# Patient Record
Sex: Female | Born: 2000 | Race: Black or African American | Hispanic: No | Marital: Single | Smoking: Never smoker
Health system: Southern US, Community
[De-identification: ages and names within clinical notes are randomized; demographics above are authoritative.]

## PROBLEM LIST (undated history)

## (undated) DIAGNOSIS — F319 Bipolar disorder, unspecified: Secondary | ICD-10-CM

## (undated) DIAGNOSIS — F32A Depression, unspecified: Secondary | ICD-10-CM

## (undated) DIAGNOSIS — Z789 Other specified health status: Secondary | ICD-10-CM

## (undated) HISTORY — PX: OTHER SURGICAL HISTORY: SHX169

## (undated) HISTORY — PX: NO PAST SURGERIES: SHX2092

## (undated) HISTORY — DX: Other specified health status: Z78.9

---
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
## 2022-06-11 ENCOUNTER — Ambulatory Visit (HOSPITAL_COMMUNITY)
Admission: EM | Admit: 2022-06-11 | Discharge: 2022-06-11 | Disposition: A | Discharge status: Home / Self Care | Payer: Medicaid Other

## 2022-06-11 NOTE — ED Notes (Signed)
No answer in waiting x 3.

## 2022-06-11 NOTE — ED Notes (Signed)
No answer x 2 in waiting area.

## 2022-09-28 ENCOUNTER — Encounter (HOSPITAL_BASED_OUTPATIENT_CLINIC_OR_DEPARTMENT_OTHER): Payer: Self-pay | Admitting: *Deleted

## 2022-09-28 ENCOUNTER — Other Ambulatory Visit: Payer: Self-pay

## 2022-09-28 ENCOUNTER — Emergency Department (HOSPITAL_BASED_OUTPATIENT_CLINIC_OR_DEPARTMENT_OTHER)
Admission: EM | Admit: 2022-09-28 | Discharge: 2022-09-28 | Disposition: A | Discharge status: Home / Self Care | Payer: MEDICAID | Attending: Emergency Medicine | Admitting: Emergency Medicine

## 2022-09-28 DIAGNOSIS — E876 Hypokalemia: Secondary | ICD-10-CM | POA: Diagnosis not present

## 2022-09-28 DIAGNOSIS — A5901 Trichomonal vulvovaginitis: Secondary | ICD-10-CM | POA: Diagnosis not present

## 2022-09-28 DIAGNOSIS — R1030 Lower abdominal pain, unspecified: Secondary | ICD-10-CM | POA: Diagnosis present

## 2022-09-28 LAB — COMPREHENSIVE METABOLIC PANEL
ALT: 19 U/L (ref 0–44)
AST: 19 U/L (ref 15–41)
Albumin: 4.3 g/dL (ref 3.5–5.0)
Alkaline Phosphatase: 35 U/L — ABNORMAL LOW (ref 38–126)
Anion gap: 8 (ref 5–15)
BUN: 11 mg/dL (ref 6–20)
CO2: 22 mmol/L (ref 22–32)
Calcium: 8.9 mg/dL (ref 8.9–10.3)
Chloride: 105 mmol/L (ref 98–111)
Creatinine, Ser: 0.73 mg/dL (ref 0.44–1.00)
GFR, Estimated: >60 mL/min (ref >60)
Glucose, Bld: 91 mg/dL (ref 70–99)
Potassium: 3.4 mmol/L — ABNORMAL LOW (ref 3.5–5.1)
Sodium: 135 mmol/L (ref 135–145)
Total Bilirubin: 0.7 mg/dL (ref 0.3–1.2)
Total Protein: 8.4 g/dL — ABNORMAL HIGH (ref 6.5–8.1)

## 2022-09-28 LAB — CBC WITH DIFFERENTIAL/PLATELET
Abs Immature Granulocytes: 0.01 10*3/uL (ref 0.00–0.07)
Basophils Absolute: 0 10*3/uL (ref 0.0–0.1)
Basophils Relative: 1 %
Eosinophils Absolute: 0.1 10*3/uL (ref 0.0–0.5)
Eosinophils Relative: 3 %
HCT: 36 % (ref 36.0–46.0)
Hemoglobin: 11.5 g/dL — ABNORMAL LOW (ref 12.0–15.0)
Immature Granulocytes: 0 %
Lymphocytes Relative: 49 %
Lymphs Abs: 2.8 10*3/uL (ref 0.7–4.0)
MCH: 23.9 pg — ABNORMAL LOW (ref 26.0–34.0)
MCHC: 31.9 g/dL (ref 30.0–36.0)
MCV: 74.8 fL — ABNORMAL LOW (ref 80.0–100.0)
Monocytes Absolute: 0.5 10*3/uL (ref 0.1–1.0)
Monocytes Relative: 8 %
Neutro Abs: 2.2 10*3/uL (ref 1.7–7.7)
Neutrophils Relative %: 39 %
Platelets: 289 10*3/uL (ref 150–400)
RBC: 4.81 MIL/uL (ref 3.87–5.11)
RDW: 15.9 % — ABNORMAL HIGH (ref 11.5–15.5)
WBC: 5.7 10*3/uL (ref 4.0–10.5)
nRBC: 0 % (ref 0.0–0.2)

## 2022-09-28 LAB — PREGNANCY, URINE: Preg Test, Ur: NEGATIVE

## 2022-09-28 LAB — WET PREP, GENITAL
Sperm: NONE SEEN
WBC, Wet Prep HPF POC: >=10 — AB (ref <10)
Yeast Wet Prep HPF POC: NONE SEEN

## 2022-09-28 LAB — URINALYSIS, W/ REFLEX TO CULTURE (INFECTION SUSPECTED)
Bilirubin Urine: NEGATIVE
Glucose, UA: NEGATIVE mg/dL
Ketones, ur: NEGATIVE mg/dL
Nitrite: NEGATIVE
Protein, ur: 100 mg/dL — AB
Specific Gravity, Urine: 1.025 (ref 1.005–1.030)
WBC, UA: >50 WBC/hpf (ref 0–5)
pH: 7.5 (ref 5.0–8.0)

## 2022-09-28 LAB — LIPASE, BLOOD: Lipase: 26 U/L (ref 11–51)

## 2022-09-28 MED ORDER — LIDOCAINE HCL (PF) 1 % IJ SOLN
INTRAMUSCULAR | Status: AC
  Administered 2022-09-28: 1 mL
  Filled 2022-09-28: qty 5

## 2022-09-28 MED ORDER — CEFTRIAXONE SODIUM 500 MG IJ SOLR
500.0000 mg | Freq: Once | INTRAMUSCULAR | Status: AC
  Administered 2022-09-28: 500 mg via INTRAMUSCULAR
  Filled 2022-09-28: qty 500

## 2022-09-28 MED ORDER — DOXYCYCLINE HYCLATE 100 MG PO TABS
100.0000 mg | ORAL_TABLET | Freq: Once | ORAL | Status: AC
  Administered 2022-09-28: 100 mg via ORAL
  Filled 2022-09-28: qty 1

## 2022-09-28 MED ORDER — POTASSIUM CHLORIDE CRYS ER 20 MEQ PO TBCR
40.0000 meq | EXTENDED_RELEASE_TABLET | Freq: Once | ORAL | Status: AC
  Administered 2022-09-28: 40 meq via ORAL
  Filled 2022-09-28: qty 2

## 2022-09-28 MED ORDER — DOXYCYCLINE HYCLATE 100 MG PO CAPS: 100.0000 mg | ORAL_CAPSULE | Freq: Two times a day (BID) | ORAL | 0 refills | Status: DC

## 2022-09-28 MED ORDER — METRONIDAZOLE 500 MG PO TABS: 500.0000 mg | ORAL_TABLET | Freq: Two times a day (BID) | ORAL | 0 refills | Status: DC

## 2022-09-28 MED ORDER — METRONIDAZOLE 500 MG PO TABS
500.0000 mg | ORAL_TABLET | Freq: Once | ORAL | Status: AC
  Administered 2022-09-28: 500 mg via ORAL
  Filled 2022-09-28: qty 1

## 2022-09-28 NOTE — ED Provider Notes (Signed)
[place] [facility] Provider Note   CSN: [id number] Arrival date & time: 09/28/22  1635     History  Chief Complaint  Patient presents with   Abdominal Pain    Kiara Moreno is a 22 y.o. female.  HPI 22 year old female presents with multiple complaints.  Her chief complaint is lower abdominal pain.  For the past couple months she has been having lower abdominal pain.  Feels like a cramping sensation that lasts a couple minutes.  Mostly notices it about a minute or so after eating.  She has not had a menstrual cycle in over a month which is atypical for her.  She took pregnancy test that were negative.  She also been having vaginal discharge for the past couple months.  She has unprotected intercourse with 1 person that would like STI testing.  She states she also gets on and off chest pain after smoking.  No current abdominal or chest pain.  No dysuria.  Home Medications Prior to Admission medications   Medication Sig Start Date End Date Taking? Authorizing Provider  doxycycline (VIBRAMYCIN) 100 MG capsule Take 1 capsule (100 mg total) by mouth 2 (two) times daily. 09/28/22  Yes Pricilla Loveless, MD  metroNIDAZOLE (FLAGYL) 500 MG tablet Take 1 tablet (500 mg total) by mouth 2 (two) times daily. 09/28/22  Yes Pricilla Loveless, MD  ibuprofen (ADVIL) 600 MG tablet Take 1 tablet (600 mg total) by mouth every 6 (six) hours as needed. 04/13/21   Arabella Merles, CNM  Norethindrone Acetate-Ethinyl Estrad-FE (LOESTRIN 24 FE) 1-20 MG-MCG(24) tablet Take 1 tablet by mouth daily. 06/20/21   Brock Bad, MD  Prenat-Fe Poly-Methfol-FA-DHA (VITAFOL ULTRA) 29-0.6-0.4-200 MG CAPS Take 1 capsule by mouth daily before breakfast. 06/20/21   Brock Bad, MD      Allergies    Patient has no known allergies.    Review of Systems   Review of Systems  Respiratory:  Negative for shortness of breath.   Cardiovascular:  Positive for chest pain.  Gastrointestinal:   Positive for abdominal pain.  Genitourinary:  Positive for menstrual problem and vaginal discharge. Negative for dysuria.    Physical Exam Updated Vital Signs BP (!) 146/82 (BP Location: Right Arm)   Pulse 72   Temp 98.4 F (36.9 C) (Oral)   Resp 17   SpO2 100%  Physical Exam Vitals and nursing note reviewed.  Constitutional:      Appearance: She is well-developed.  HENT:     Head: Normocephalic and atraumatic.  Cardiovascular:     Rate and Rhythm: Normal rate and regular rhythm.     Heart sounds: Normal heart sounds.  Pulmonary:     Effort: Pulmonary effort is normal.     Breath sounds: Normal breath sounds.  Abdominal:     Palpations: Abdomen is soft.     Tenderness: There is no abdominal tenderness.  Skin:    General: Skin is warm and dry.  Neurological:     Mental Status: She is alert.     ED Results / Procedures / Treatments   Labs (all labs ordered are listed, but only abnormal results are displayed) Labs Reviewed  WET PREP, GENITAL - Abnormal; Notable for the following components:      Result Value   Trich, Wet Prep PRESENT (*)    Clue Cells Wet Prep HPF POC PRESENT (*)    WBC, Wet Prep HPF POC >=10 (*)    All other components within  normal limits  URINALYSIS, W/ REFLEX TO CULTURE (INFECTION SUSPECTED) - Abnormal; Notable for the following components:   APPearance CLOUDY (*)    Hgb urine dipstick TRACE (*)    Protein, ur 100 (*)    Leukocytes,Ua MODERATE (*)    Bacteria, UA MANY (*)    All other components within normal limits  CBC WITH DIFFERENTIAL/PLATELET - Abnormal; Notable for the following components:   Hemoglobin 11.5 (*)    MCV 74.8 (*)    MCH 23.9 (*)    RDW 15.9 (*)    All other components within normal limits  PREGNANCY, URINE  RPR  HIV ANTIBODY (ROUTINE TESTING W REFLEX)  COMPREHENSIVE METABOLIC PANEL  LIPASE, BLOOD  GC/CHLAMYDIA PROBE AMP ([place]) NOT AT [facility]    EKG EKG Interpretation Date/Time:  "Sunday September 28 2022  22:23:50 EDT Ventricular Rate:  76 PR Interval:  157 QRS Duration:  83 QT Interval:  385 QTC Calculation: 433 R Axis:   12  Text Interpretation: Sinus rhythm no acute ST/T changes no significant change since 2022 Confirmed by Tahmid Stonehocker ([id number]) on 09/28/2022 10:29:04 PM  Radiology No results found.  Procedures Procedures    Medications Ordered in ED Medications  cefTRIAXone (ROCEPHIN) injection 500 mg (has no administration in time range)  doxycycline (VIBRA-TABS) tablet 100 mg (has no administration in time range)  metroNIDAZOLE (FLAGYL) tablet 500 mg (has no administration in time range)    ED Course/ Medical Decision Making/ A&P                                 Medical Decision Making Amount and/or Complexity of Data Reviewed Labs: ordered.    Details: Hemoglobin chronically low and stable.  Trichomonas positive on wet prep.   Risk Prescription drug management.   Patient's testing shows positive trichomonas on her wet prep.  She had concerned about STI and so we will treat with a week of Flagyl.  There is also other leukocytes on the wet prep after discussion, she would prefer to just go ahead and treat for GC/chlamydia with Rocephin and doxycycline.  She has had a history of abdominal pain on and off but this seems pretty mild and intermittent and there is no tenderness now so I think PID or TOA or other acute complication is highly unlikely.  I do not think imaging is needed right now.  Mildly low potassium will be repleted.  Will discharge home with return precautions.        Final Clinical Impression(s) / ED Diagnoses Final diagnoses:  Trichomonas vaginitis    Rx / DC Orders ED Discharge Orders          Ordered    doxycycline (VIBRAMYCIN) 100 MG capsule  2 times daily        09/28/22 2307    metroNIDAZOLE (FLAGYL) 500 MG tablet  2 times daily        08" /11/24 2307              Pricilla Loveless, MD 09/28/22 2329

## 2022-09-28 NOTE — ED Triage Notes (Addendum)
Here for abd cramping intermittently for > 1` month. Denies sx at this time.

## 2022-09-28 NOTE — Discharge Instructions (Addendum)
You are being treated for sexually transmitted infection called trichomonas.  You need to take the full course of antibiotics.  We are also covering you for potential other sexually transmitted infections that will not result for the next couple days.  Do not drink alcohol or combine these medications with other medications during the course that you are on them for the next week.  Do not have intercourse until your symptoms have cleared and you have finished the antibiotics.  Follow-up with the health department.  If you develop new or worsening abdominal pain, vomiting, fever, or any other new/concerning symptoms then return to the ER or call 911.

## 2022-10-16 ENCOUNTER — Other Ambulatory Visit: Payer: Self-pay

## 2022-10-16 ENCOUNTER — Encounter (HOSPITAL_BASED_OUTPATIENT_CLINIC_OR_DEPARTMENT_OTHER): Payer: Self-pay

## 2022-10-16 DIAGNOSIS — R102 Pelvic and perineal pain: Secondary | ICD-10-CM | POA: Insufficient documentation

## 2022-10-16 DIAGNOSIS — N898 Other specified noninflammatory disorders of vagina: Secondary | ICD-10-CM | POA: Insufficient documentation

## 2022-10-16 NOTE — ED Triage Notes (Signed)
Pt reports left side cramping x 2 days and thick yellow vaginal discharge with itching x 2 weeks. Pt seen and treated for same x 2 weeks ago and states that discharge subsided while taking antibiotics but after having sex, symptoms returned.

## 2022-10-17 ENCOUNTER — Emergency Department (HOSPITAL_BASED_OUTPATIENT_CLINIC_OR_DEPARTMENT_OTHER): Payer: MEDICAID

## 2022-10-17 ENCOUNTER — Emergency Department (HOSPITAL_BASED_OUTPATIENT_CLINIC_OR_DEPARTMENT_OTHER)
Admission: EM | Admit: 2022-10-17 | Discharge: 2022-10-17 | Disposition: A | Discharge status: Home / Self Care | Payer: MEDICAID | Attending: Emergency Medicine | Admitting: Emergency Medicine

## 2022-10-17 DIAGNOSIS — R102 Pelvic and perineal pain: Secondary | ICD-10-CM

## 2022-10-17 DIAGNOSIS — B9689 Other specified bacterial agents as the cause of diseases classified elsewhere: Secondary | ICD-10-CM

## 2022-10-17 LAB — WET PREP, GENITAL
Sperm: NONE SEEN
Trich, Wet Prep: NONE SEEN
WBC, Wet Prep HPF POC: <10 (ref <10)
Yeast Wet Prep HPF POC: NONE SEEN

## 2022-10-17 LAB — URINALYSIS, ROUTINE W REFLEX MICROSCOPIC
Bilirubin Urine: NEGATIVE
Glucose, UA: NEGATIVE mg/dL
Hgb urine dipstick: NEGATIVE
Nitrite: NEGATIVE
Protein, ur: 30 mg/dL — AB
Specific Gravity, Urine: 1.037 — ABNORMAL HIGH (ref 1.005–1.030)
pH: 7 (ref 5.0–8.0)

## 2022-10-17 LAB — PREGNANCY, URINE: Preg Test, Ur: NEGATIVE

## 2022-10-17 MED ORDER — METRONIDAZOLE 500 MG PO TABS: 500.0000 mg | ORAL_TABLET | Freq: Two times a day (BID) | ORAL | 0 refills | Status: DC

## 2022-10-17 NOTE — ED Provider Notes (Signed)
[place] [facility] AT [facility] Provider Note   CSN: [id number] Arrival date & time: 10/16/22  2059     History  Chief Complaint  Patient presents with   Vaginal Discharge    Kiara Moreno is a 22 y.o. female.  Patient is a 22 year old female with no significant past medical history.  Patient presenting today with complaints of pelvic pain and vaginal discharge.  She reports having pelvic pain following intercourse over the past year since having her first child.  She is now describing vaginal discharge that has a bad smell.  This seems to occur primarily following intercourse.  She denies any fevers or chills.  No alleviating factors.  The history is provided by the patient.       Home Medications Prior to Admission medications   Medication Sig Start Date End Date Taking? Authorizing Provider  doxycycline (VIBRAMYCIN) 100 MG capsule Take 1 capsule (100 mg total) by mouth 2 (two) times daily. 09/28/22   Pricilla Loveless, MD  ibuprofen (ADVIL) 600 MG tablet Take 1 tablet (600 mg total) by mouth every 6 (six) hours as needed. 04/13/21   Arabella Merles, CNM  metroNIDAZOLE (FLAGYL) 500 MG tablet Take 1 tablet (500 mg total) by mouth 2 (two) times daily. 09/28/22   Pricilla Loveless, MD  Norethindrone Acetate-Ethinyl Estrad-FE (LOESTRIN 24 FE) 1-20 MG-MCG(24) tablet Take 1 tablet by mouth daily. 06/20/21   Brock Bad, MD  Prenat-Fe Poly-Methfol-FA-DHA (VITAFOL ULTRA) 29-0.6-0.4-200 MG CAPS Take 1 capsule by mouth daily before breakfast. 06/20/21   Brock Bad, MD      Allergies    Patient has no known allergies.    Review of Systems   Review of Systems  All other systems reviewed and are negative.   Physical Exam Updated Vital Signs BP 125/75 (BP Location: Right Arm)   Pulse 90   Temp 97.8 F (36.6 C)   Resp 20   SpO2 100%  Physical Exam Vitals and nursing note reviewed.  Constitutional:      General: She is not in acute distress.     Appearance: She is well-developed. She is not diaphoretic.  HENT:     Head: Normocephalic and atraumatic.  Cardiovascular:     Rate and Rhythm: Normal rate and regular rhythm.     Heart sounds: No murmur heard.    No friction rub. No gallop.  Pulmonary:     Effort: Pulmonary effort is normal. No respiratory distress.     Breath sounds: Normal breath sounds. No wheezing.  Abdominal:     General: Bowel sounds are normal. There is no distension.     Palpations: Abdomen is soft.     Tenderness: There is abdominal tenderness. There is no guarding or rebound.     Comments: There is mild tenderness in the suprapubic region.  Musculoskeletal:        General: Normal range of motion.     Cervical back: Normal range of motion and neck supple.  Skin:    General: Skin is warm and dry.  Neurological:     General: No focal deficit present.     Mental Status: She is alert and oriented to person, place, and time.     ED Results / Procedures / Treatments   Labs (all labs ordered are listed, but only abnormal results are displayed) Labs Reviewed  URINALYSIS, ROUTINE W REFLEX MICROSCOPIC - Abnormal; Notable for the following components:      Result Value   Specific  Gravity, Urine 1.037 (*)    Ketones, ur TRACE (*)    Protein, ur 30 (*)    Leukocytes,Ua MODERATE (*)    Bacteria, UA RARE (*)    All other components within normal limits  WET PREP, GENITAL  PREGNANCY, URINE  GC/CHLAMYDIA PROBE AMP ([place]) NOT AT [facility]    EKG None  Radiology No results found.  Procedures Procedures    Medications Ordered in ED Medications - No data to display  ED Course/ Medical Decision Making/ A&P  Patient is a 22 year old female presenting with complaints of pelvic pain and vaginal discharge as described in the HPI.  She arrives here with stable vital signs and physical examination which is basically unremarkable.  Urinalysis shows moderate leukocytes, but negative nitrates and no bacteria.   Pregnancy test is negative.  Vaginal swab shows clue cells on the wet prep.  Ultrasound of the pelvis obtained showing no evidence for torsion or other acute abnormality.  Patient to be treated with Flagyl for BV.  GC and Chlamydia test pending and patient will be notified if positive.  Final Clinical Impression(s) / ED Diagnoses Final diagnoses:  None    Rx / DC Orders ED Discharge Orders     None         Geoffery Lyons, MD 10/17/22 [phone]

## 2022-10-17 NOTE — Discharge Instructions (Signed)
Begin taking Flagyl as prescribed.  Follow-up with your GYN if symptoms persist.

## 2022-10-23 LAB — GC/CHLAMYDIA PROBE AMP (~~LOC~~) NOT AT ARMC
Chlamydia: NEGATIVE
Comment: NEGATIVE
Comment: NORMAL
Neisseria Gonorrhea: NEGATIVE

## 2022-12-22 IMAGING — US US OB < 14 WEEKS - US OB TV
1 series · 13 of 28 positions shown · non-contrast
Comparison: None

CLINICAL DATA: Vaginal bleeding in first trimester of pregnancy,
quantitative beta HCG

EXAM:
TWIN OBSTETRIC <14WK US AND TRANSVAGINAL OB US
TECHNIQUE: Both transabdominal and transvaginal ultrasound examinations were
performed for complete evaluation of the gestation as well as the
maternal uterus, adnexal regions, and pelvic cul-de-sac.
Transvaginal technique was performed to assess early pregnancy.

[Series 1: us ob < 14 weeks - us ob tv · 63 acquisitions, 13 frames shown]
[im 3/63]
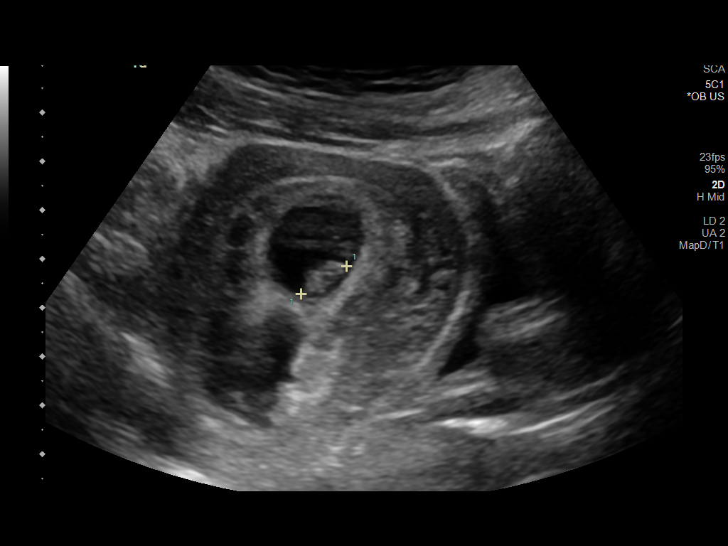
[im 7/63]
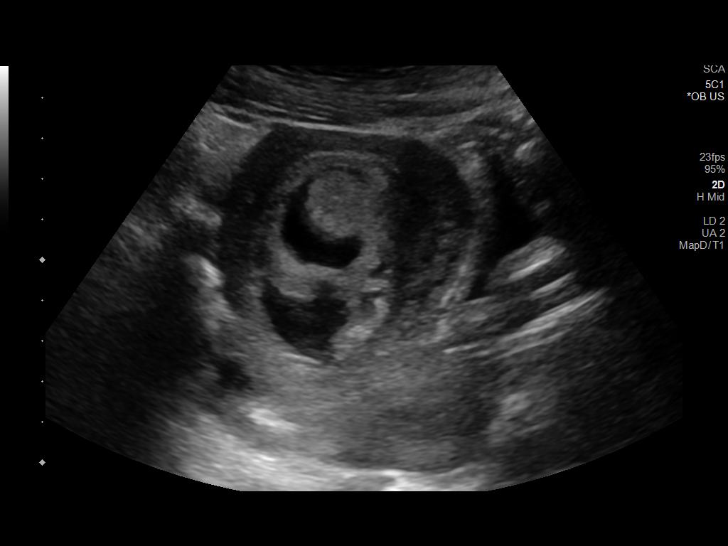
[im 12/63]
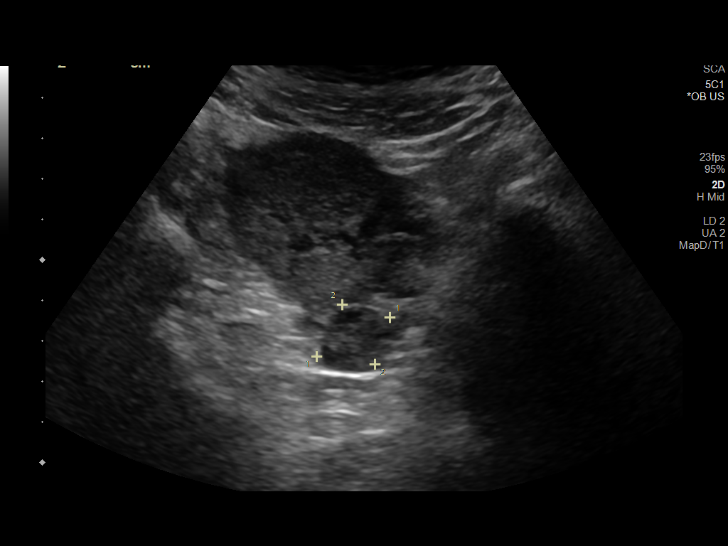
[im 17/63]
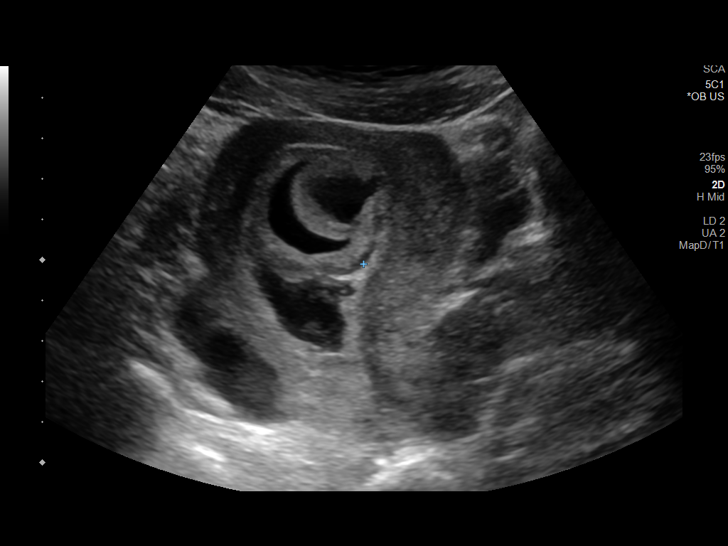
[im 21/63]
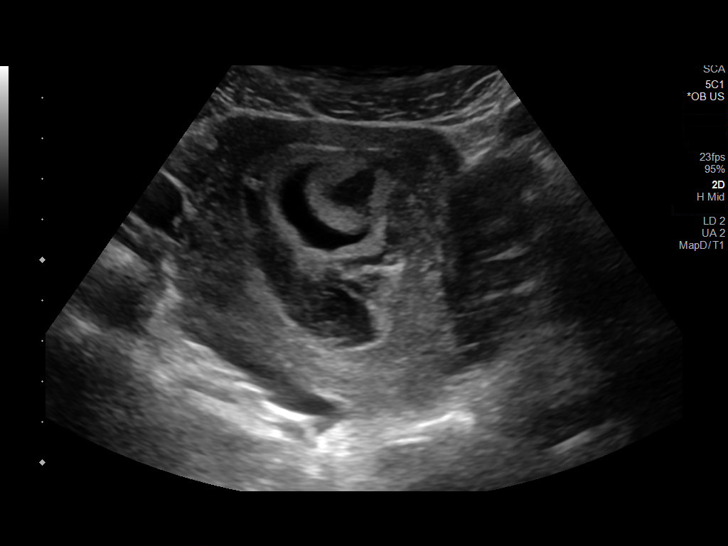
[im 26/63]
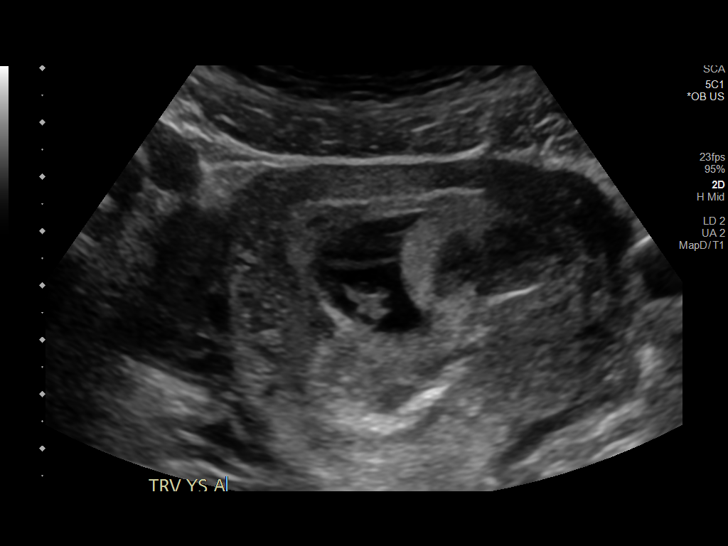
[im 33/63]
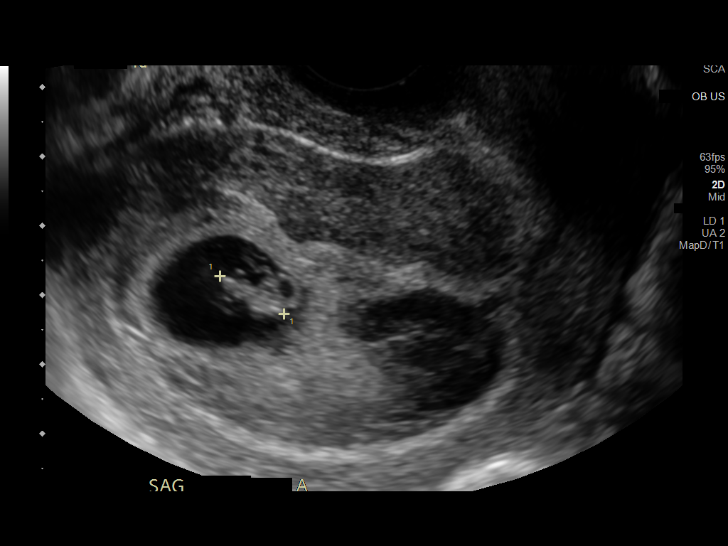
[im 37/63]
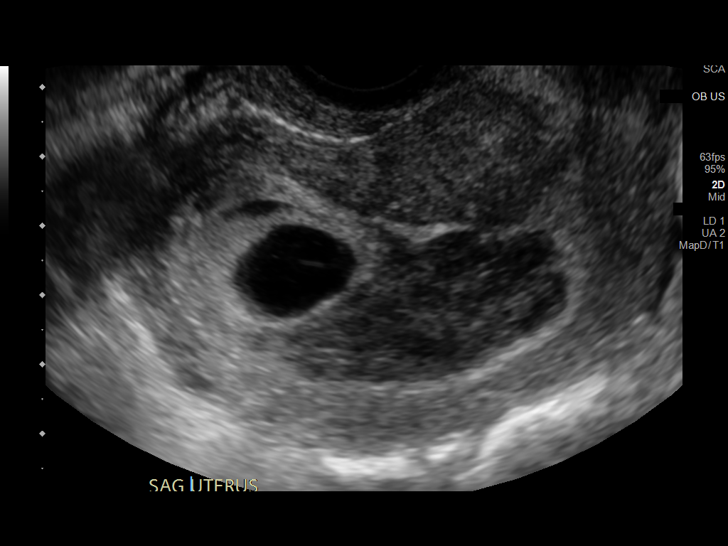
[im 42/63]
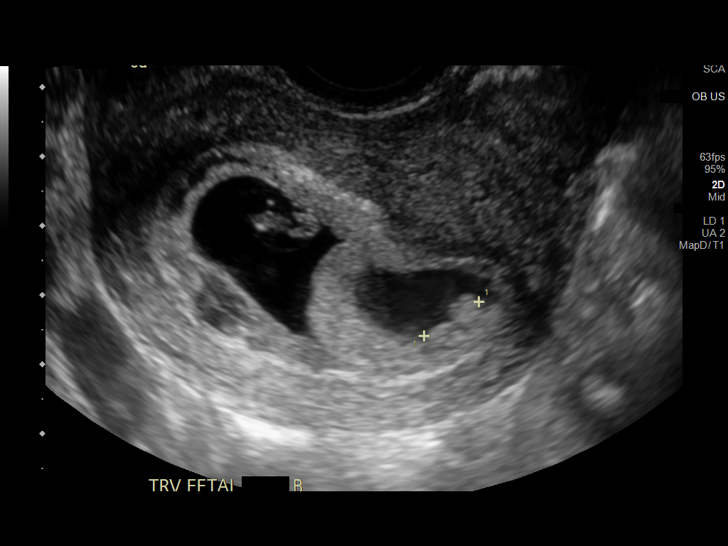
[im 46/63]
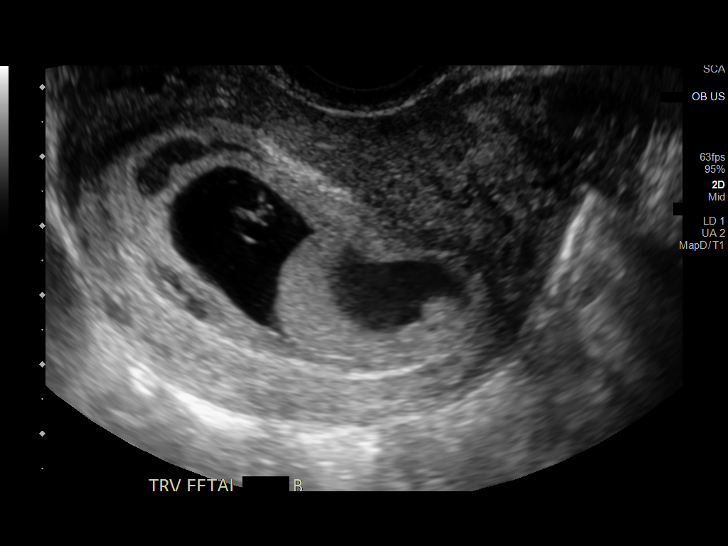
[im 51/63]
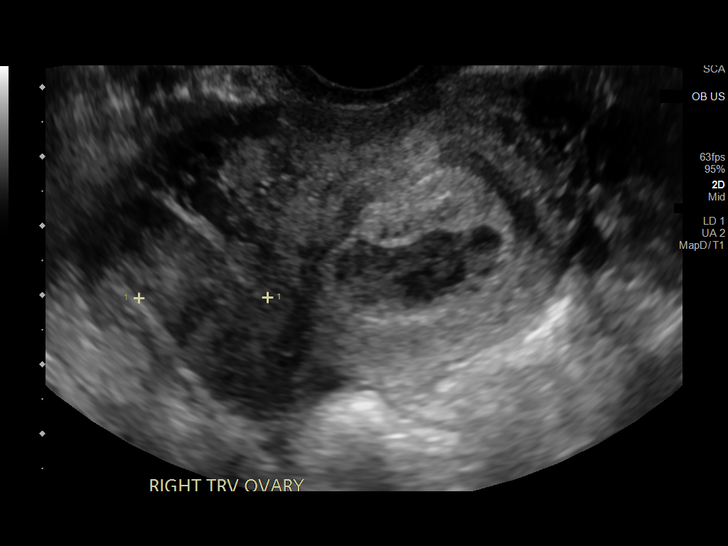
[im 56/63]
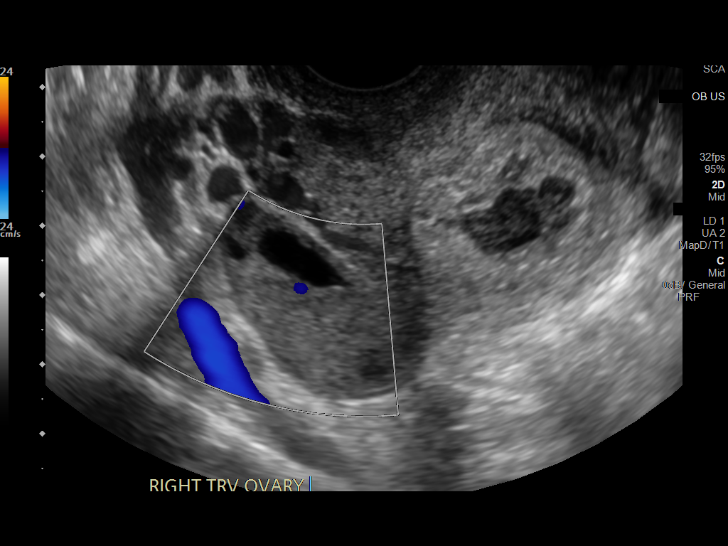
[im 60/63]
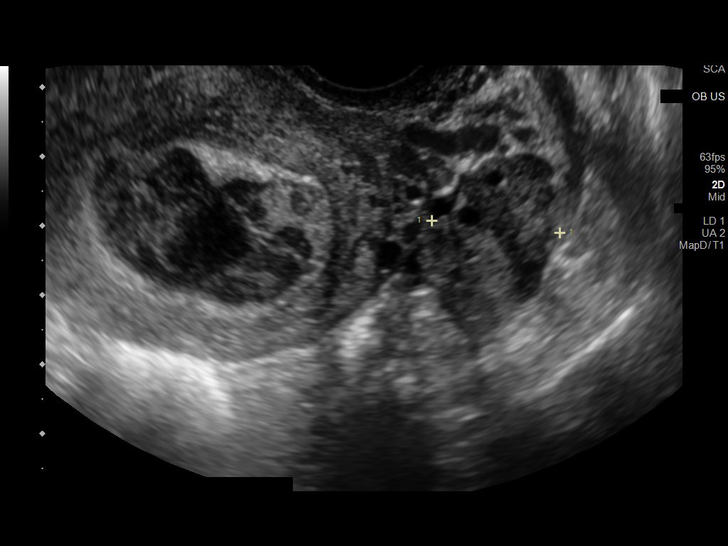

[13 of 28 positions shown; findings below may reference images not displayed]

FINDINGS: Number of IUPs:  2

Chorionicity/Amnionicity:  Dichorionic-diamniotic (thick membrane)

TWIN 1

Yolk sac:  Present

Embryo:  Present

Cardiac Activity: Present

Heart Rate: 141 bpm

CRL:  10.7 mm   7 w 1 d                  US EDC: 05/19/2021

TWIN 2

Yolk sac:  Absent

Embryo:  Present

Cardiac Activity: Absent

Heart Rate: N/A bpm

CRL:  8.2 mm   6 w 5 d                  US EDC: 05/22/2021

Subchorionic hemorrhage:  Large subchorionic hemorrhage

Maternal uterus/adnexae:

RIGHT ovary measures 4.6 x 1.7 x 2.6 cm and contains a 3.6 cm
diameter simple cyst.

LEFT ovary normal size and morphology, 1.9 x 2.8 x 1.8 cm.

No free pelvic fluid or adnexal masses.
IMPRESSION: Twin intrauterine gestation with large subchronic hemorrhage.

Twin 1 appears live at 7 weeks 1 day EGA by crown-rump length, no
abnormalities.

Twin 2 demonstrates no fetal cardiac activity; findings meet
definitive criteria for failed pregnancy of Twin 2. This follows SRU
consensus guidelines: Diagnostic Criteria for Nonviable Pregnancy
Early in the First Trimester. N Engl J Med 4533;[DATE].

## 2023-01-28 ENCOUNTER — Other Ambulatory Visit: Payer: Self-pay

## 2023-01-28 ENCOUNTER — Encounter (HOSPITAL_COMMUNITY): Payer: Self-pay

## 2023-01-28 ENCOUNTER — Emergency Department (HOSPITAL_COMMUNITY)
Admission: EM | Admit: 2023-01-28 | Discharge: 2023-01-29 | Discharge status: Against Medical Advice | Payer: MEDICAID | Attending: Emergency Medicine | Admitting: Emergency Medicine

## 2023-01-28 DIAGNOSIS — Z5321 Procedure and treatment not carried out due to patient leaving prior to being seen by health care provider: Secondary | ICD-10-CM | POA: Insufficient documentation

## 2023-01-28 DIAGNOSIS — L292 Pruritus vulvae: Secondary | ICD-10-CM | POA: Diagnosis not present

## 2023-01-28 DIAGNOSIS — R309 Painful micturition, unspecified: Secondary | ICD-10-CM | POA: Insufficient documentation

## 2023-01-28 DIAGNOSIS — R519 Headache, unspecified: Secondary | ICD-10-CM | POA: Insufficient documentation

## 2023-01-28 NOTE — ED Triage Notes (Signed)
Pt came to ED for HA that started yesterday. C/O itching to vagina and painful urination. Pt also states vaginal odor. Axox4.

## 2023-01-29 NOTE — ED Notes (Signed)
This patient was called x3 and no answer. This patient was moved OTF.

## 2023-04-05 ENCOUNTER — Encounter (HOSPITAL_BASED_OUTPATIENT_CLINIC_OR_DEPARTMENT_OTHER): Payer: Self-pay | Admitting: Emergency Medicine

## 2023-04-05 ENCOUNTER — Other Ambulatory Visit: Payer: Self-pay

## 2023-04-05 DIAGNOSIS — R112 Nausea with vomiting, unspecified: Secondary | ICD-10-CM | POA: Insufficient documentation

## 2023-04-05 DIAGNOSIS — H53149 Visual discomfort, unspecified: Secondary | ICD-10-CM | POA: Diagnosis not present

## 2023-04-05 DIAGNOSIS — R519 Headache, unspecified: Secondary | ICD-10-CM | POA: Insufficient documentation

## 2023-04-05 LAB — CBC
HCT: 36.3 % (ref 36.0–46.0)
Hemoglobin: 11.8 g/dL — ABNORMAL LOW (ref 12.0–15.0)
MCH: 26.7 pg (ref 26.0–34.0)
MCHC: 32.5 g/dL (ref 30.0–36.0)
MCV: 82.1 fL (ref 80.0–100.0)
Platelets: 243 10*3/uL (ref 150–400)
RBC: 4.42 MIL/uL (ref 3.87–5.11)
RDW: 16.4 % — ABNORMAL HIGH (ref 11.5–15.5)
WBC: 6.9 10*3/uL (ref 4.0–10.5)
nRBC: 0 % (ref 0.0–0.2)

## 2023-04-05 MED ORDER — OXYCODONE-ACETAMINOPHEN 5-325 MG PO TABS
1.0000 | ORAL_TABLET | ORAL | Status: DC | PRN
  Administered 2023-04-05: 1 via ORAL
  Filled 2023-04-05: qty 1

## 2023-04-05 MED ORDER — ONDANSETRON 4 MG PO TBDP
4.0000 mg | ORAL_TABLET | Freq: Once | ORAL | Status: AC | PRN
  Administered 2023-04-05: 4 mg via ORAL
  Filled 2023-04-05: qty 1

## 2023-04-05 NOTE — ED Triage Notes (Signed)
Headache, emesis, X since yesterday. Tried bland diet did not help.

## 2023-04-06 ENCOUNTER — Emergency Department (HOSPITAL_BASED_OUTPATIENT_CLINIC_OR_DEPARTMENT_OTHER)
Admission: EM | Admit: 2023-04-06 | Discharge: 2023-04-06 | Disposition: A | Discharge status: Home / Self Care | Payer: MEDICAID | Attending: Emergency Medicine | Admitting: Emergency Medicine

## 2023-04-06 DIAGNOSIS — R112 Nausea with vomiting, unspecified: Secondary | ICD-10-CM

## 2023-04-06 LAB — URINALYSIS, ROUTINE W REFLEX MICROSCOPIC
Bilirubin Urine: NEGATIVE
Glucose, UA: NEGATIVE mg/dL
Hgb urine dipstick: NEGATIVE
Ketones, ur: NEGATIVE mg/dL
Nitrite: NEGATIVE
Protein, ur: >=300 mg/dL — AB
Specific Gravity, Urine: 1.02 (ref 1.005–1.030)
pH: 8.5 — ABNORMAL HIGH (ref 5.0–8.0)

## 2023-04-06 LAB — COMPREHENSIVE METABOLIC PANEL
ALT: 31 U/L (ref 0–44)
AST: 22 U/L (ref 15–41)
Albumin: 3.9 g/dL (ref 3.5–5.0)
Alkaline Phosphatase: 33 U/L — ABNORMAL LOW (ref 38–126)
Anion gap: 10 (ref 5–15)
BUN: 18 mg/dL (ref 6–20)
CO2: 20 mmol/L — ABNORMAL LOW (ref 22–32)
Calcium: 8.9 mg/dL (ref 8.9–10.3)
Chloride: 104 mmol/L (ref 98–111)
Creatinine, Ser: 0.75 mg/dL (ref 0.44–1.00)
GFR, Estimated: >60 mL/min (ref >60)
Glucose, Bld: 124 mg/dL — ABNORMAL HIGH (ref 70–99)
Potassium: 3.4 mmol/L — ABNORMAL LOW (ref 3.5–5.1)
Sodium: 134 mmol/L — ABNORMAL LOW (ref 135–145)
Total Bilirubin: 0.5 mg/dL (ref 0.0–1.2)
Total Protein: 7.3 g/dL (ref 6.5–8.1)

## 2023-04-06 LAB — URINALYSIS, MICROSCOPIC (REFLEX): WBC, UA: >50 WBC/hpf (ref 0–5)

## 2023-04-06 LAB — LIPASE, BLOOD: Lipase: 22 U/L (ref 11–51)

## 2023-04-06 LAB — PREGNANCY, URINE: Preg Test, Ur: NEGATIVE

## 2023-04-06 MED ORDER — ONDANSETRON 4 MG PO TBDP: ORAL_TABLET | ORAL | 0 refills | Status: DC

## 2023-04-06 NOTE — ED Provider Notes (Signed)
[place] [facility] Provider Note   CSN: [id number] Arrival date & time: 04/05/23  2255     History  Chief Complaint  Patient presents with   Emesis   Headache    Kiara Moreno is a 23 y.o. female.  Had a headache with photophobia and emesis earlier tonight. Had some meds prior to my arrival and feels better at this time. No headache. No neuro changes. Tolerating PO. No other associated symptoms.    Emesis Associated symptoms: headaches   Headache Associated symptoms: vomiting        Home Medications Prior to Admission medications   Medication Sig Start Date End Date Taking? Authorizing Provider  ondansetron (ZOFRAN-ODT) 4 MG disintegrating tablet 4mg  ODT q4 hours prn nausea/vomit 04/06/23  Yes Yanet Balliet, Barbara Cower, MD  doxycycline (VIBRAMYCIN) 100 MG capsule Take 1 capsule (100 mg total) by mouth 2 (two) times daily. 09/28/22   Pricilla Loveless, MD  ibuprofen (ADVIL) 600 MG tablet Take 1 tablet (600 mg total) by mouth every 6 (six) hours as needed. 04/13/21   Arabella Merles, CNM  metroNIDAZOLE (FLAGYL) 500 MG tablet Take 1 tablet (500 mg total) by mouth 2 (two) times daily. One po bid x 7 days 10/17/22   Geoffery Lyons, MD  Norethindrone Acetate-Ethinyl Estrad-FE (LOESTRIN 24 FE) 1-20 MG-MCG(24) tablet Take 1 tablet by mouth daily. 06/20/21   Brock Bad, MD  Prenat-Fe Poly-Methfol-FA-DHA (VITAFOL ULTRA) 29-0.6-0.4-200 MG CAPS Take 1 capsule by mouth daily before breakfast. 06/20/21   Brock Bad, MD      Allergies    Patient has no known allergies.    Review of Systems   Review of Systems  Gastrointestinal:  Positive for vomiting.  Neurological:  Positive for headaches.    Physical Exam Updated Vital Signs BP 117/65 (BP Location: Right Arm)   Pulse 75   Temp 98.6 F (37 C)   Resp 16   Ht 5\' 2"  (1.575 m)   Wt 90.7 kg   LMP  (LMP Unknown)   SpO2 99%   BMI 36.58 kg/m  Physical Exam Vitals and nursing note reviewed.   Constitutional:      Appearance: She is well-developed.  HENT:     Head: Normocephalic and atraumatic.  Cardiovascular:     Rate and Rhythm: Normal rate and regular rhythm.  Pulmonary:     Effort: No respiratory distress.     Breath sounds: No stridor.  Abdominal:     General: There is no distension.  Musculoskeletal:     Cervical back: Normal range of motion.  Neurological:     Mental Status: She is alert.     Comments: No altered mental status, able to give full seemingly accurate history.  Face is symmetric, EOM's intact, pupils equal and reactive, vision intact, tongue and uvula midline without deviation. Upper and Lower extremity motor 5/5, intact pain perception in distal extremities, 2+ reflexes in biceps, patella and achilles tendons. Able to perform finger to nose normal with both hands. Walks without assistance or evident ataxia.       ED Results / Procedures / Treatments   Labs (all labs ordered are listed, but only abnormal results are displayed) Labs Reviewed  COMPREHENSIVE METABOLIC PANEL - Abnormal; Notable for the following components:      Result Value   Sodium 134 (*)    Potassium 3.4 (*)    CO2 20 (*)    Glucose, Bld 124 (*)    Alkaline Phosphatase  33 (*)    All other components within normal limits  CBC - Abnormal; Notable for the following components:   Hemoglobin 11.8 (*)    RDW 16.4 (*)    All other components within normal limits  LIPASE, BLOOD  PREGNANCY, URINE  URINALYSIS, ROUTINE W REFLEX MICROSCOPIC    EKG None  Radiology No results found.  Procedures Procedures    Medications Ordered in ED Medications  oxyCODONE-acetaminophen (PERCOCET/ROXICET) 5-325 MG per tablet 1 tablet (1 tablet Oral Given 04/05/23 2345)  ondansetron (ZOFRAN-ODT) disintegrating tablet 4 mg (4 mg Oral Given 04/05/23 2343)    ED Course/ Medical Decision Making/ A&P                                 Medical Decision Making Amount and/or Complexity of Data  Reviewed Labs: ordered.  Risk Prescription drug management.   Asymptomatic. No indication for workup. Possibly migraine. No trauma. No infectious symptoms.   Final Clinical Impression(s) / ED Diagnoses Final diagnoses:  Nausea and vomiting, unspecified vomiting type    Rx / DC Orders ED Discharge Orders          Ordered    ondansetron (ZOFRAN-ODT) 4 MG disintegrating tablet        04/06/23 0319              [name], Barbara Cower, MD 04/06/23 1610

## 2023-05-15 ENCOUNTER — Encounter: Payer: Self-pay | Admitting: *Deleted

## 2023-05-15 ENCOUNTER — Ambulatory Visit
Admission: EM | Admit: 2023-05-15 | Discharge: 2023-05-15 | Disposition: A | Discharge status: Home / Self Care | Payer: MEDICAID | Attending: Family Medicine | Admitting: Family Medicine

## 2023-05-15 DIAGNOSIS — Z202 Contact with and (suspected) exposure to infections with a predominantly sexual mode of transmission: Secondary | ICD-10-CM | POA: Insufficient documentation

## 2023-05-15 NOTE — ED Provider Notes (Signed)
 [facility]    CSN: [id number] Arrival date & time: 05/15/23  1846      History   Chief Complaint Chief Complaint  Patient presents with   Labs Only    HPI Kiara Moreno is a 23 y.o. female.   HPI Here for STD testing.  She states she has no symptoms.  No vomiting or fever.  Last menstrual cycle was March 21  NKDA Past Medical History:  Diagnosis Date   Depression    Medical history non-contributory     Patient Active Problem List   Diagnosis Date Noted   SVD (spontaneous vaginal delivery) 04/11/2021   Preterm premature rupture of membranes 04/11/2021   Preterm premature rupture of membranes (PPROM) with unknown onset of labor 04/10/2021   Indication for care in labor or delivery 04/10/2021   Alpha thalassemia silent carrier 01/01/2021   Marijuana use during pregnancy 12/21/2020   History of drug overdose 12/21/2020   Back pain in pregnancy 12/21/2020   Biological false positive RPR test 12/21/2020   Encounter for supervision of normal pregnancy in teen primigravida, antepartum 11/07/2020   Severe episode of recurrent major depressive disorder, 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 Authorizing Provider  ibuprofen (ADVIL) 600 MG tablet Take 1 tablet (600 mg total) by mouth every 6 (six) hours as needed. Patient not taking: Reported on 05/15/2023 04/13/21   Arabella Merles, CNM  MICROGESTIN 1-20 MG-MCG tablet Take 1 tablet by mouth daily. Patient not taking: Reported  on 05/15/2023 03/31/23   [provider]  Norethindrone Acetate-Ethinyl Estrad-FE (LOESTRIN 24 FE) 1-20 MG-MCG(24) tablet Take 1 tablet by mouth daily. Patient not taking: Reported on 05/15/2023 06/20/21   Brock Bad, MD  ondansetron (ZOFRAN-ODT) 4 MG disintegrating tablet 4mg  ODT q4 hours prn nausea/vomit Patient not taking: Reported on 05/15/2023 04/06/23   Mesner, Barbara Cower, MD  Prenat-Fe Poly-Methfol-FA-DHA (VITAFOL ULTRA) 29-0.6-0.4-200 MG CAPS Take 1 capsule by mouth daily before breakfast. Patient not taking: Reported on 05/15/2023 06/20/21   Brock Bad, MD  valACYclovir (VALTREX) 500 MG tablet Take 500 mg by mouth daily. Patient not taking: Reported on 05/15/2023 04/06/23   [provider]    Family History Family History  Problem Relation Age of Onset   Diabetes Neg Hx    Hypertension Neg Hx     Social History Social History   Tobacco Use   Smoking status: Never   Smokeless tobacco: Never  Vaping Use   Vaping status: Unknown  Substance Use Topics   Alcohol use: Not Currently   Drug use: Not Currently    Types: Marijuana     Allergies   Patient has no known allergies.   Review of  Systems Review of Systems   Physical Exam Triage Vital Signs ED Triage Vitals [05/15/23 1920]  Encounter Vitals Group     BP (!) 160/93     Systolic BP Percentile      Diastolic BP Percentile      Pulse Rate 84     Resp 18     Temp 98.7 F (37.1 C)     Temp Source Oral     SpO2 98 %     Weight      Height      Head Circumference      Peak Flow      Pain Score 0     Pain Loc      Pain Education      Exclude from Growth Chart    No data found.  Updated Vital Signs BP (!) 149/83 (BP Location: Right Arm)   Pulse 84   Temp 98.7 F (37.1 C) (Oral)   Resp 18   LMP 05/08/2023 (Approximate)   SpO2 98%   Visual Acuity Right Eye Distance:   Left Eye Distance:   Bilateral Distance:    Right Eye Near:   Left Eye Near:    Bilateral Near:      Physical Exam Vitals reviewed.  Constitutional:      General: She is not in acute distress.    Appearance: She is not toxic-appearing.  Skin:    Coloration: Skin is not pale.  Neurological:     Mental Status: She is alert and oriented to person, place, and time.  Psychiatric:        Behavior: Behavior normal.      UC Treatments / Results  Labs (all labs ordered are listed, but only abnormal results are displayed) Labs Reviewed  CERVICOVAGINAL ANCILLARY ONLY    EKG   Radiology No results found.  Procedures Procedures (including critical care time)  Medications Ordered in UC Medications - No data to display  Initial Impression / Assessment and Plan / UC Course  I have reviewed the triage vital signs and the nursing notes.  Pertinent labs & imaging results that were available during my care of the patient were reviewed by me and considered in my medical decision making (see chart for details).     Vaginal self swab is done, and we will notify of any positives on that and treat per protocol. She declines testing for HIV and RPR at this time.  She got upset that we could not give her results immediately, as are vaginal swab goes to the hospital results in 48 hours or so from when the test is run.  We discussed this and she can check her MyChart and staff will notify her if anything is positive. Final Clinical Impressions(s) / UC Diagnoses   Final diagnoses:  Potential exposure to STD     Discharge Instructions      Staff will notify you if there is anything positive on the swab.     ED Prescriptions   None    PDMP not reviewed this encounter.   Zenia Resides, MD 05/15/23 2007

## 2023-05-15 NOTE — ED Triage Notes (Signed)
 Pt requests STI testing. Denies symptoms

## 2023-05-15 NOTE — Discharge Instructions (Signed)
 Staff will notify you if there is anything positive on the swab

## 2023-05-18 ENCOUNTER — Telehealth (HOSPITAL_COMMUNITY): Payer: Self-pay

## 2023-05-18 LAB — CERVICOVAGINAL ANCILLARY ONLY
Chlamydia: POSITIVE — AB
Comment: NEGATIVE
Comment: NEGATIVE
Comment: NORMAL
Neisseria Gonorrhea: POSITIVE — AB
Trichomonas: POSITIVE — AB

## 2023-05-18 MED ORDER — DOXYCYCLINE HYCLATE 100 MG PO CAPS: 100.0000 mg | ORAL_CAPSULE | Freq: Two times a day (BID) | ORAL | 0 refills | Status: DC

## 2023-05-18 MED ORDER — METRONIDAZOLE 500 MG PO TABS: 500.0000 mg | ORAL_TABLET | Freq: Two times a day (BID) | ORAL | 0 refills | Status: AC

## 2023-05-18 NOTE — Telephone Encounter (Signed)
 Per protocol, pt will need to return to UC for Nurse Visit for Rocephin 500mg  IM.  Per protocol, pt requires tx with metronidazole and Doxycycline.  Attempted to reach patient x1. VM full. Rx sent to pharmacy on file.

## 2023-05-29 ENCOUNTER — Ambulatory Visit
Admission: EM | Admit: 2023-05-29 | Discharge: 2023-05-29 | Disposition: A | Discharge status: Home / Self Care | Payer: MEDICAID | Attending: Family Medicine | Admitting: Family Medicine

## 2023-05-29 DIAGNOSIS — Z113 Encounter for screening for infections with a predominantly sexual mode of transmission: Secondary | ICD-10-CM | POA: Diagnosis present

## 2023-05-29 NOTE — ED Triage Notes (Signed)
 Pt request STI testing, Denies any symptoms.

## 2023-05-29 NOTE — ED Provider Notes (Signed)
 [facility]  Note:  This document was prepared using Conservation officer, historic buildings and may include unintentional dictation errors.  MRN: [id number] DOB: July 23, 2000  Subjective:   Kiara Moreno is a 23 y.o. female presenting for STI screening.  Patient does not want HIV and syphilis testing.  She would like to do the vaginal cytology only.  Denies fever, n/v, abdominal pain, pelvic pain, rashes, dysuria, urinary frequency, hematuria, vaginal discharge.    No current facility-administered medications for this encounter.  Current Outpatient Medications:    doxycycline (VIBRAMYCIN) 100 MG capsule, Take 1 capsule (100 mg total) by mouth 2 (two) times daily., Disp: 14 capsule, Rfl: 0   ibuprofen (ADVIL) 600 MG tablet, Take 1 tablet (600 mg total) by mouth every 6 (six) hours as needed. (Patient not taking: Reported on 05/15/2023), Disp: 30 tablet, Rfl: 0   MICROGESTIN 1-20 MG-MCG tablet, Take 1 tablet by mouth daily. (Patient not taking: Reported on 05/15/2023), Disp: , Rfl:    Norethindrone Acetate-Ethinyl Estrad-FE (LOESTRIN 24 FE) 1-20 MG-MCG(24) tablet, Take 1 tablet by mouth daily. (Patient not taking: Reported on 05/15/2023), Disp: 28 tablet, Rfl: 11   ondansetron (ZOFRAN-ODT) 4 MG disintegrating tablet, 4mg  ODT q4 hours prn nausea/vomit (Patient not taking: Reported on 05/15/2023), Disp: 30 tablet, Rfl: 0   Prenat-Fe Poly-Methfol-FA-DHA (VITAFOL ULTRA) 29-0.6-0.4-200 MG CAPS, Take 1 capsule by mouth daily before breakfast. (Patient not taking: Reported on 05/15/2023), Disp: 90 capsule, Rfl: 4   valACYclovir (VALTREX) 500 MG tablet, Take 500 mg by mouth daily. (Patient not taking: Reported on 05/15/2023), Disp: , Rfl:    No Known Allergies  Past Medical History:  Diagnosis Date   Depression    Medical history non-contributory      Past Surgical History:  Procedure Laterality Date   arm fracture surgery     NO PAST SURGERIES     WISDOM TOOTH EXTRACTION Bilateral  11/18/2019    Family History  Problem Relation Age of Onset   Diabetes Neg Hx    Hypertension Neg Hx     Social History   Tobacco Use   Smoking status: Never   Smokeless tobacco: Never  Vaping Use   Vaping status: Unknown  Substance Use Topics   Alcohol use: Not Currently   Drug use: Not Currently    Types: Marijuana    ROS   Objective:   Vitals: BP (!) 151/82 (BP Location: Left Arm)   Pulse (!) 115   Temp 99.1 F (37.3 C) (Oral)   Resp 18   LMP 05/08/2023 (Approximate)   SpO2 98%   Physical Exam Constitutional:      General: She is not in acute distress.    Appearance: Normal appearance. She is well-developed. She is not ill-appearing, toxic-appearing or diaphoretic.  HENT:     Head: Normocephalic and atraumatic.     Nose: Nose normal.     Mouth/Throat:     Mouth: Mucous membranes are moist.  Eyes:     General: No scleral icterus.       Right eye: No discharge.        Left eye: No discharge.     Extraocular Movements: Extraocular movements intact.  Cardiovascular:     Rate and Rhythm: Normal rate.  Pulmonary:     Effort: Pulmonary effort is normal.  Skin:    General: Skin is warm and dry.  Neurological:     General: No focal deficit present.     Mental Status: She is  alert and oriented to person, place, and time.  Psychiatric:        Mood and Affect: Mood normal.        Behavior: Behavior normal.     Assessment and Plan :   PDMP not reviewed this encounter.  1. Screen for STD (sexually transmitted disease)    Will treat as appropriate based off of the lab results.   Wallis Bamberg, PA-C 05/29/23 1031

## 2023-05-30 ENCOUNTER — Ambulatory Visit: Payer: MEDICAID

## 2023-06-03 ENCOUNTER — Telehealth (HOSPITAL_COMMUNITY): Payer: Self-pay

## 2023-06-03 ENCOUNTER — Other Ambulatory Visit: Payer: Self-pay

## 2023-06-03 ENCOUNTER — Other Ambulatory Visit (HOSPITAL_BASED_OUTPATIENT_CLINIC_OR_DEPARTMENT_OTHER): Payer: Self-pay

## 2023-06-03 ENCOUNTER — Emergency Department (HOSPITAL_BASED_OUTPATIENT_CLINIC_OR_DEPARTMENT_OTHER)
Admission: EM | Admit: 2023-06-03 | Discharge: 2023-06-03 | Disposition: A | Discharge status: Home / Self Care | Payer: MEDICAID | Attending: Emergency Medicine | Admitting: Emergency Medicine

## 2023-06-03 ENCOUNTER — Encounter (HOSPITAL_BASED_OUTPATIENT_CLINIC_OR_DEPARTMENT_OTHER): Payer: Self-pay | Admitting: Emergency Medicine

## 2023-06-03 DIAGNOSIS — N3001 Acute cystitis with hematuria: Secondary | ICD-10-CM | POA: Diagnosis not present

## 2023-06-03 DIAGNOSIS — R300 Dysuria: Secondary | ICD-10-CM | POA: Diagnosis present

## 2023-06-03 DIAGNOSIS — B9689 Other specified bacterial agents as the cause of diseases classified elsewhere: Secondary | ICD-10-CM

## 2023-06-03 DIAGNOSIS — A599 Trichomoniasis, unspecified: Secondary | ICD-10-CM | POA: Insufficient documentation

## 2023-06-03 DIAGNOSIS — N760 Acute vaginitis: Secondary | ICD-10-CM | POA: Diagnosis not present

## 2023-06-03 LAB — URINALYSIS, ROUTINE W REFLEX MICROSCOPIC
Bilirubin Urine: NEGATIVE
Glucose, UA: NEGATIVE mg/dL
Ketones, ur: NEGATIVE mg/dL
Nitrite: NEGATIVE
Protein, ur: 100 mg/dL — AB
Specific Gravity, Urine: 1.02 (ref 1.005–1.030)
pH: 7 (ref 5.0–8.0)

## 2023-06-03 LAB — CERVICOVAGINAL ANCILLARY ONLY
Chlamydia: NEGATIVE
Comment: NEGATIVE
Comment: NEGATIVE
Comment: NORMAL
Neisseria Gonorrhea: NEGATIVE
Trichomonas: POSITIVE — AB

## 2023-06-03 LAB — WET PREP, GENITAL
Sperm: NONE SEEN
WBC, Wet Prep HPF POC: <10 (ref <10)
Yeast Wet Prep HPF POC: NONE SEEN

## 2023-06-03 LAB — URINALYSIS, MICROSCOPIC (REFLEX): RBC / HPF: >50 RBC/hpf (ref 0–5)

## 2023-06-03 LAB — PREGNANCY, URINE: Preg Test, Ur: NEGATIVE

## 2023-06-03 MED ORDER — NITROFURANTOIN MONOHYD MACRO 100 MG PO CAPS
100.0000 mg | ORAL_CAPSULE | Freq: Two times a day (BID) | ORAL | 0 refills | Status: DC
  Filled 2023-06-03: qty 10, 5d supply, fill #0

## 2023-06-03 MED ORDER — METRONIDAZOLE 500 MG PO TABS
500.0000 mg | ORAL_TABLET | Freq: Two times a day (BID) | ORAL | 0 refills | Status: AC
  Filled 2023-06-03: qty 14, 7d supply, fill #0

## 2023-06-03 NOTE — Telephone Encounter (Signed)
 Based on most recent cytology, pt requires tx with metronidazole per protocol.  TC to pt, who is currently waiting at ER d/t severe dysuria. Advised pt of results, and asked if she had ever received medications this writer had previously sent in for results on 3/28. Pt states she did not. Pt has not received treatment for +chlamydia, gonorrhea, or trichomonas from 3/28. Advised pt to continue with being seen in ER for further tx. Verbalized understanding. Sent metro in to [facility] at [facility] per pt request.

## 2023-06-03 NOTE — ED Notes (Signed)
 Pt alert and oriented X 4 at the time of discharge. RR even and unlabored. No acute distress noted. Pt verbalized understanding of discharge instructions as discussed. Pt ambulatory to lobby at time of discharge.

## 2023-06-03 NOTE — ED Notes (Signed)
 During triage pt. Appeared restless and not compliant with answering questions.

## 2023-06-03 NOTE — Discharge Instructions (Addendum)
 You have already been prescribed an antibiotic called metronidazole by urgent care to treat your trichomoniasis and bacterial vaginosis infection.  This prescription is available at our pharmacy here.  Please pick this prescription up on your way out today, and start this medication tonight.  Please take this as prescribed.  The trichomoniasis is a sexually transmitted infection.  Please complete a full course of treatment, and have your partner complete a course of treatment before engaging in sexual activity.  You have also been prescribed another antibiotic called nitrofurantoin for a urinary tract infection.  Please take this twice daily for the next 5 days.  Please take this in addition to the metronidazole.  Please follow-up with your PCP for recheck of symptoms within the next week.  Return to the ER if you have any uncontrolled vomiting, fevers, severe abdominal pain, any other new or concerning symptoms

## 2023-06-03 NOTE — ED Provider Notes (Signed)
 [facility] Provider Note   CSN: [id number] Arrival date & time: 06/03/23  1223     History  Chief Complaint  Patient presents with   Dysuria    Kiara Moreno is a 23 y.o. female who presents with concern for dysuria for the past week.  Does not want to provide history and is not cooperative with any exam.  She continues to look down at her phone and does not answer any questions when I walk into the room.  She states "you guys are bringing people back before me on purpose".    Dysuria      Home Medications Prior to Admission medications   Medication Sig Start Date End Date Taking? Authorizing Provider  nitrofurantoin , macrocrystal-monohydrate, (MACROBID ) 100 MG capsule Take 1 capsule (100 mg total) by mouth 2 (two) times daily. 06/03/23  Yes Rexie Catena, PA-C  doxycycline  (VIBRAMYCIN ) 100 MG capsule Take 1 capsule (100 mg total) by mouth 2 (two) times daily. 05/18/23   Ann Keto, MD  ibuprofen  (ADVIL ) 600 MG tablet Take 1 tablet (600 mg total) by mouth every 6 (six) hours as needed. Patient not taking: Reported on 05/15/2023 04/13/21   Jolayne Natter, CNM  metroNIDAZOLE  (FLAGYL ) 500 MG tablet Take 1 tablet (500 mg total) by mouth 2 (two) times daily for 7 days. 06/03/23 06/10/23  Banister, Pamela K, MD  MICROGESTIN 1-20 MG-MCG tablet Take 1 tablet by mouth daily. Patient not taking: Reported on 05/15/2023 03/31/23   [provider]  Norethindrone Acetate-Ethinyl Estrad-FE (LOESTRIN 24 FE) 1-20 MG-MCG(24) tablet Take 1 tablet by mouth daily. Patient not taking: Reported on 05/15/2023 06/20/21   Gabrielle Joiner, MD  ondansetron  (ZOFRAN -ODT) 4 MG disintegrating tablet 4mg  ODT q4 hours prn nausea/vomit Patient not taking: Reported on 05/15/2023 04/06/23   Mesner, Reymundo Caulk, MD  Prenat-Fe Poly-Methfol-FA-DHA (VITAFOL  ULTRA) 29-0.6-0.4-200 MG CAPS Take 1 capsule by mouth daily before breakfast. Patient not taking: Reported on  05/15/2023 06/20/21   Gabrielle Joiner, MD  valACYclovir (VALTREX) 500 MG tablet Take 500 mg by mouth daily. Patient not taking: Reported on 05/15/2023 04/06/23   [provider]      Allergies    Patient has no known allergies.    Review of Systems   Review of Systems  Genitourinary:  Positive for dysuria.    Physical Exam Updated Vital Signs BP (!) 126/93 (BP Location: Right Arm)   Pulse 97   Temp 98.7 F (37.1 C)   Resp 20   LMP 05/08/2023 (Approximate)   SpO2 100%  Physical Exam Vitals and nursing note reviewed.  Constitutional:      General: She is not in acute distress.    Appearance: Normal appearance. She is not ill-appearing or toxic-appearing.  HENT:     Head: Atraumatic.  Pulmonary:     Effort: Pulmonary effort is normal.  Neurological:     General: No focal deficit present.     Mental Status: She is alert.  Psychiatric:        Mood and Affect: Mood normal.        Behavior: Behavior normal.    Patient declines physical exam  ED Results / Procedures / Treatments   Labs (all labs ordered are listed, but only abnormal results are displayed) Labs Reviewed  WET PREP, GENITAL - Abnormal; Notable for the following components:      Result Value   Trich, Wet Prep PRESENT (*)    Clue Cells Wet  Prep HPF POC PRESENT (*)    All other components within normal limits  URINALYSIS, ROUTINE W REFLEX MICROSCOPIC - Abnormal; Notable for the following components:   Color, Urine RED (*)    APPearance CLOUDY (*)    Hgb urine dipstick LARGE (*)    Protein, ur 100 (*)    Leukocytes,Ua SMALL (*)    All other components within normal limits  URINALYSIS, MICROSCOPIC (REFLEX) - Abnormal; Notable for the following components:   Bacteria, UA FEW (*)    All other components within normal limits  URINE CULTURE  PREGNANCY, URINE  HIV ANTIBODY (ROUTINE TESTING W REFLEX)  GC/CHLAMYDIA PROBE AMP ([place]) NOT AT [facility]    EKG None  Radiology No results  found.  Procedures Procedures    Medications Ordered in ED Medications - No data to display  ED Course/ Medical Decision Making/ A&P                                 Medical Decision Making Amount and/or Complexity of Data Reviewed Labs: ordered.  Risk Prescription drug management.     Differential diagnosis includes but is not limited to STI, yeast, BV, cholelithiasis, cholangitis, choledocholithiasis, peptic ulcer, gastritis, gastroenteritis, appendicitis, IBS, IBD, DKA, nephrolithiasis, UTI, pyelonephritis, pancreatitis, diverticulitis, mesenteric ischemia, abdominal aortic aneurysm, small bowel obstruction, volvulus, ovarian torsion and pregnancy related concerns in females of childbearing age    ED Course:  Upon initial evaluation, patient has stable vital signs, no acute distress.  Is not answering any questions asked to her.  She refuses any physical exam.  She does report dysuria for the past week, and when asked to give a urine sample, states "why can't you just treat me".  She then states she will "pee right here in the room" and does not use the restroom to provide urine sample.  Patient declines pelvic exam, declines self swab for STDs and wet prep.  I was able to see a positive trichomoniasis test from 05/29/2023 from urgent care, we can treat for this today.  Labs Ordered: I Ordered, and personally interpreted labs.  The pertinent results include:   Patient performed wet prep swab herself.  This was positive for trichomoniasis and BV Urinalysis with leukocytes, no squamous epithelial cells.  Proteinuria present. Urine culture sent Pregnancy test negative  Upon re-evaluation, patient still well-appearing.  Remains very uncooperative with staff. She was encouraged to perform a self swab which tested positive for trichomoniasis and bacterial vaginosis.  She declines any physical exam or lab work, unable to assess for possible intra-abdominal pathology or PID.  She does  not have any known exposure to gonorrhea chlamydia, no indication for treatment at this time.    Impression: Bacterial vaginosis Trichomoniasis Cystitis  Disposition:  The patient was discharged home with instructions to take the course of metronidazole  as prescribed by urgent care.  Take course of nitrofurantoin  as prescribed for her urinary tract infection.  Refrain from sexual intercourse until both her and her partners have completed treatment.  Follow-up with PCP for recheck of symptoms in the next week. Return precautions given.    This chart was dictated using voice recognition software, Dragon. Despite the best efforts of this provider to proofread and correct errors, errors may still occur which can change documentation meaning.          Final Clinical Impression(s) / ED Diagnoses Final diagnoses:  Trichomoniasis  Bacterial vaginosis  Acute  cystitis with hematuria    Rx / DC Orders ED Discharge Orders          Ordered    nitrofurantoin , macrocrystal-monohydrate, (MACROBID ) 100 MG capsule  2 times daily        06/03/23 1730              Rexie Catena, PA-C 06/03/23 1743    Teddi Favors, DO 06/05/23 [phone]

## 2023-06-03 NOTE — ED Triage Notes (Signed)
 Dysuria and vaginal pain x 1 week, unprotected sexual intercourse x 2 days . Hx Herpes and gonorrhea per pt .

## 2023-06-04 LAB — URINE CULTURE: Culture: 30000 — AB

## 2023-06-04 LAB — GC/CHLAMYDIA PROBE AMP (~~LOC~~) NOT AT ARMC
Chlamydia: NEGATIVE
Comment: NEGATIVE
Comment: NORMAL
Neisseria Gonorrhea: NEGATIVE

## 2023-06-05 ENCOUNTER — Telehealth (HOSPITAL_BASED_OUTPATIENT_CLINIC_OR_DEPARTMENT_OTHER): Payer: Self-pay | Admitting: Emergency Medicine

## 2023-06-05 NOTE — Telephone Encounter (Signed)
 Post ED Visit - Positive Culture Follow-up  Culture report reviewed by antimicrobial stewardship pharmacist: [facility] []  Court Distance, Pharm.D. []  Skeet Duke, Pharm.D., BCPS AQ-ID []  Leslee Rase, Pharm.D., BCPS []  Garland Junk, Pharm.D., BCPS []  [place], [address].D., BCPS, AAHIVP []  Alcide Aly, Pharm.D., BCPS, AAHIVP [x]  Jerri Morale, PharmD, BCPS []  Graham Laws, PharmD, BCPS []  Cleda Curly, PharmD, BCPS []  Tamar Fairly, PharmD []  Ballard Levels, PharmD, BCPS []  Ollen Beverage, PharmD  [facility] []  Arlyne Bering, PharmD []  Sherryle Don, PharmD []  Van Gelinas, PharmD []  Delila Felty, Rph []  [place]) Cleora Daft, PharmD []  Augustina Block, PharmD []  Arie Kurtz, PharmD []  Sharlyn Deaner, PharmD []  Agnes Hose, PharmD []  Kendall Pauls, PharmD []  Gladstone Lamer, PharmD []  Armanda Bern, PharmD []  Tera Fellows, PharmD   Positive urine culture Treated with Nitrofurantoin , organism sensitive to the same and no further patient follow-up is required at this time.  Ellison Ha [place]ola Dimmitt 06/05/2023, 2:26 PM

## 2023-06-11 ENCOUNTER — Ambulatory Visit: Payer: Self-pay

## 2023-06-11 NOTE — Telephone Encounter (Signed)
 Pt hung up before being transferred.  Patient called, left VM to return the call to the office to speak to NT.      Copied from CRM [id number]. Topic: Clinical - Red Word Triage >> Jun 11, 2023  9:03 AM Rennis Case wrote: Red Word that prompted transfer to Nurse Triage: Patient requesting lab results from ED visit

## 2023-06-11 NOTE — Telephone Encounter (Signed)
 Pt called back, pt states she wanting to know about results from ED visit on 06/03/23. Pt specifically wanted to know about testing for HIV. Advised her I see the lab entered for HIV but was canceled d/t duplicate. Pt states she had a bunch of labs done that day and wanted to know about that. Called [facility] ED and spoke with Landa Pine, she states charge RN was tied up but got pt's information and will have Charge RN call pt back. Went to advise pt of this but she hung up. Attempted to call back but VM full.   Copied from CRM [id number]. Topic: Clinical - Red Word Triage >> Jun 11, 2023  9:03 AM Rennis Case wrote: Red Word that prompted transfer to Nurse Triage: Patient requesting lab results from ED visit

## 2023-06-20 IMAGING — US US MFM OB LIMITED
1 series · 15 of 21 positions shown · non-contrast
Comparison: none

[Series 1: us mfm ob limited · 21 acquisitions, 15 frames shown]
[im 1/21]
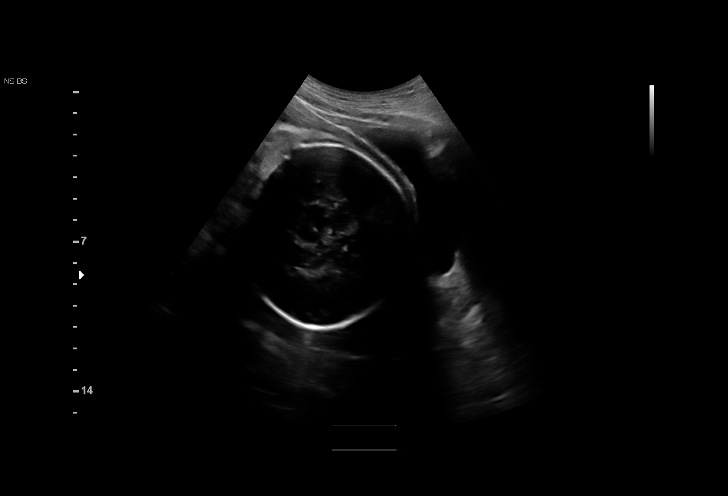
[im 3/21]
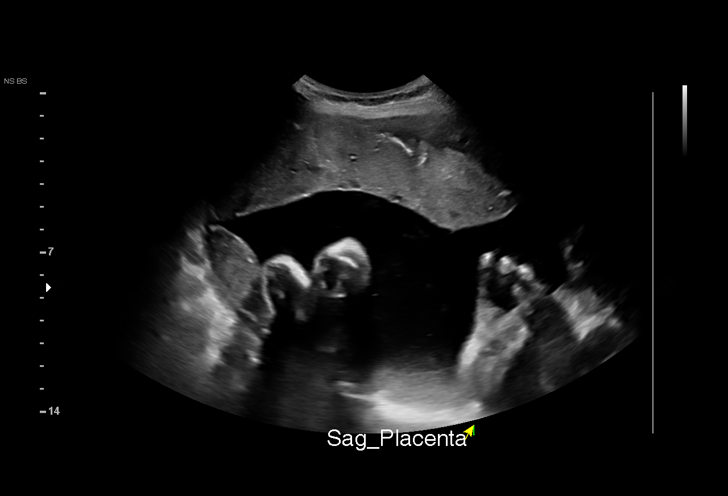
[im 4/21]
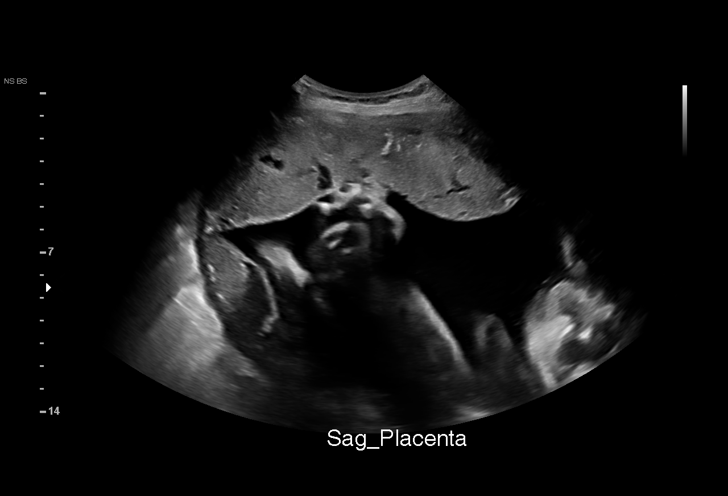
[im 5/21]
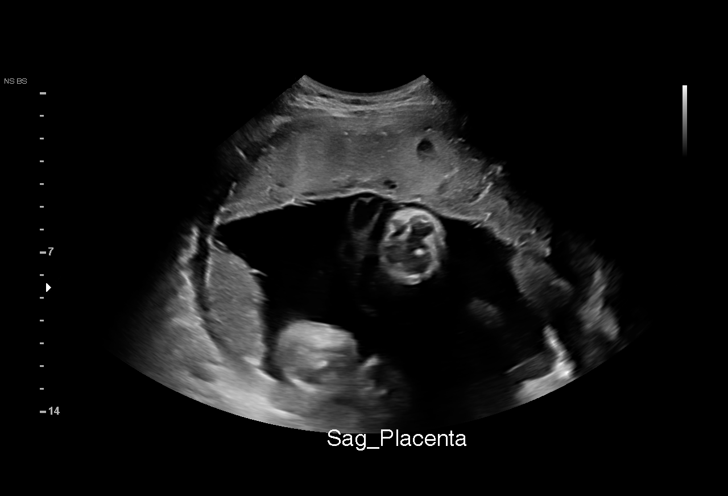
[im 7/21]
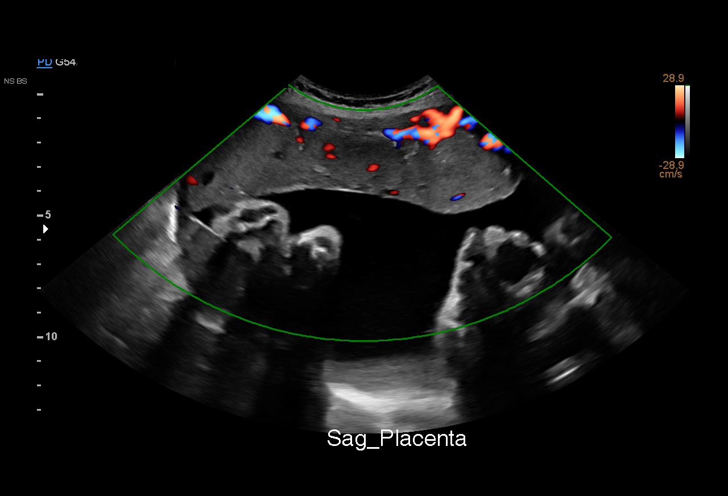
[im 8/21]
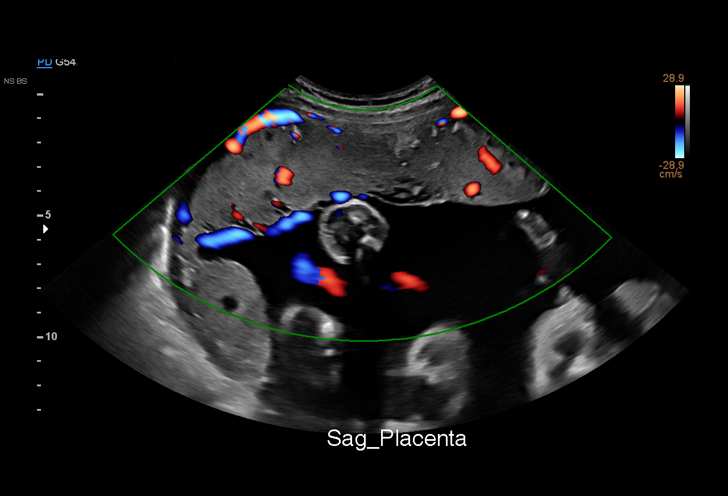
[im 10/21]
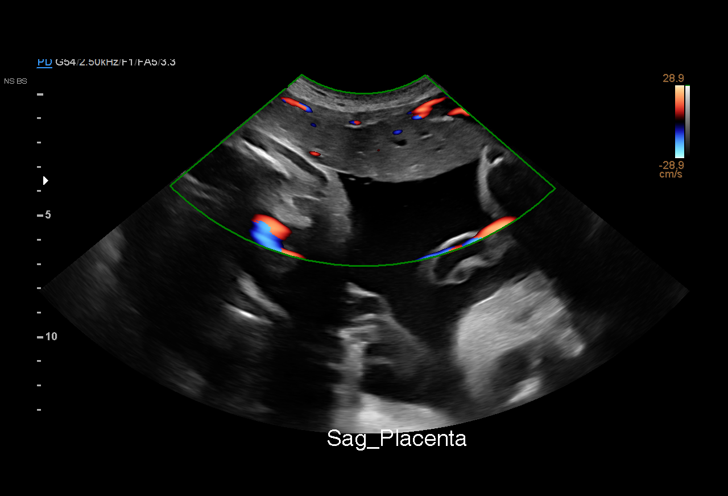
[im 11/21]
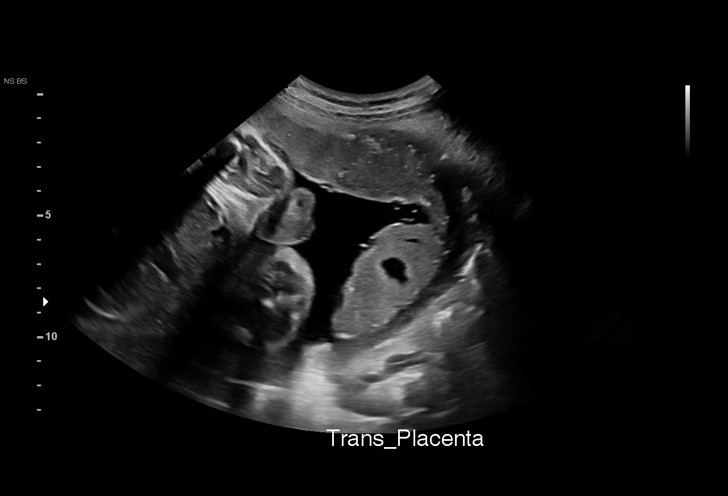
[im 12/21]
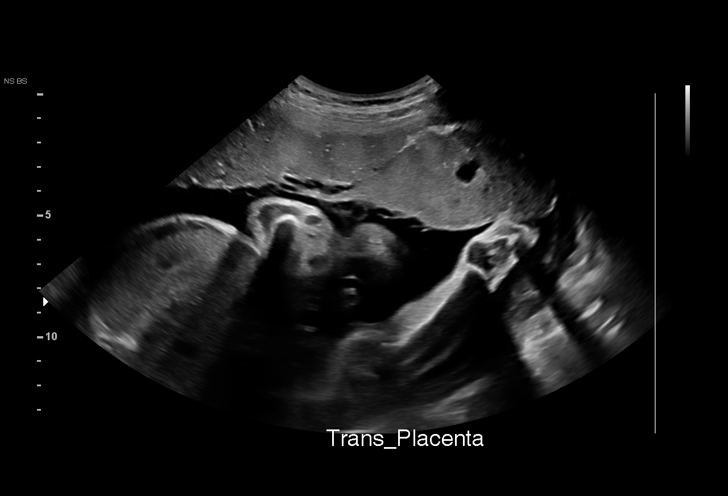
[im 14/21]
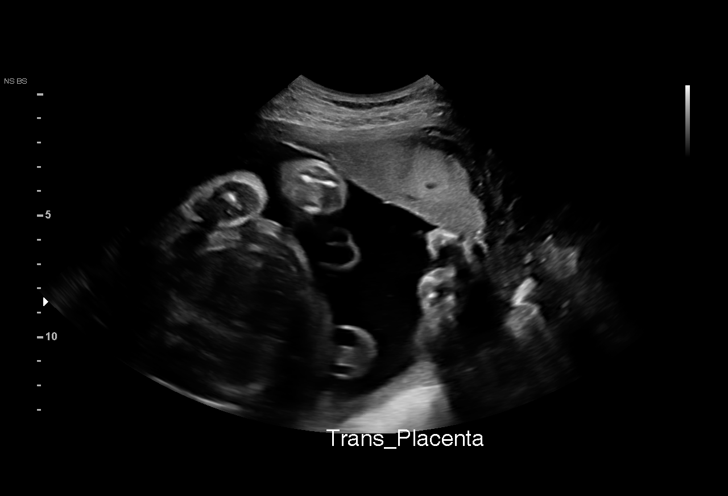
[im 15/21]
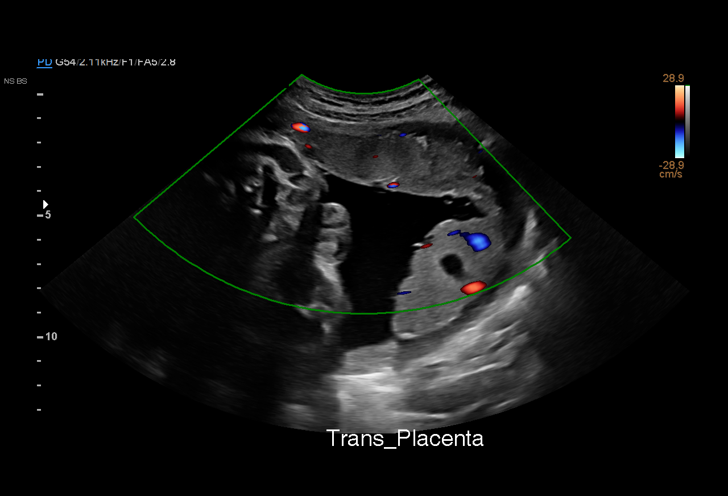
[im 17/21]
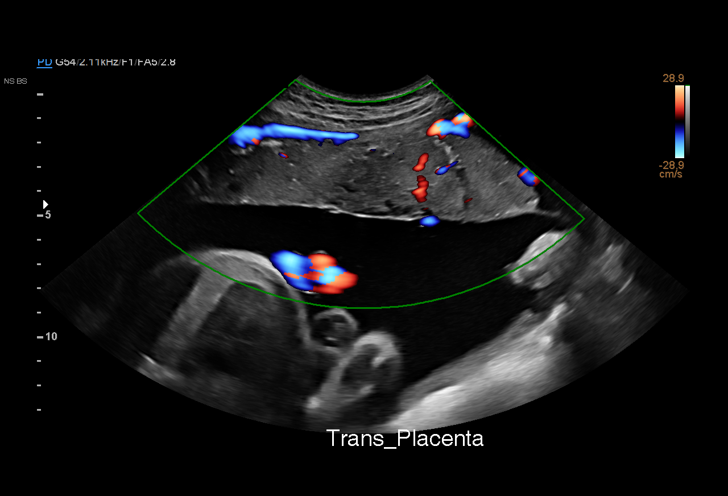
[im 18/21]
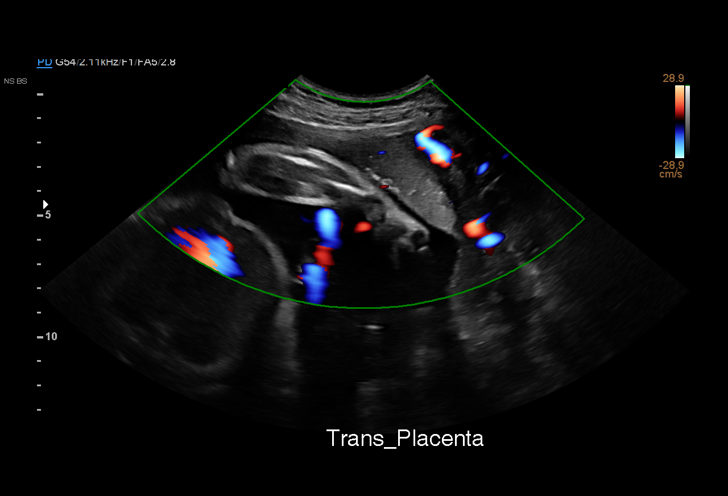
[im 19/21]
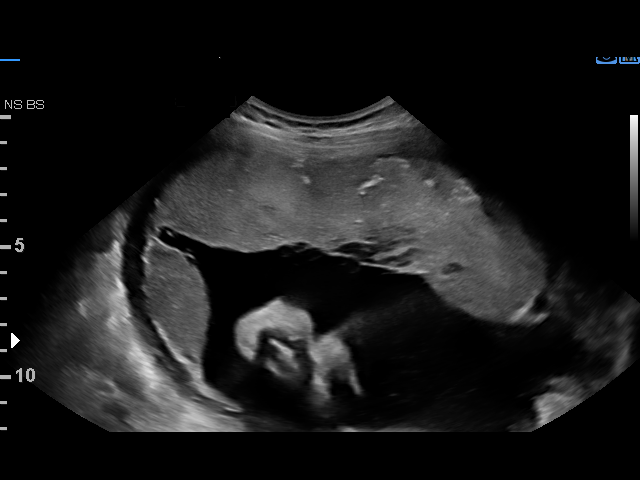
[im 21/21]
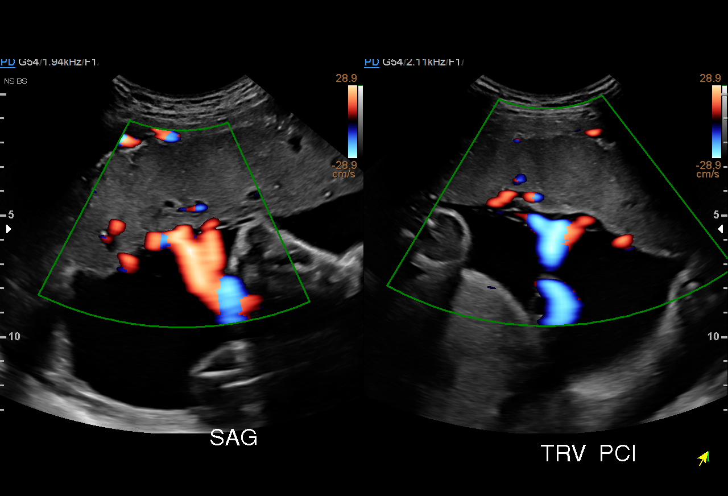

[15 of 21 positions shown; findings below may reference images not displayed]

Attending:        Bob Fredo Wivenson Onese        Secondary Phy.:    [facility] MAU/Triage
                   MANGO

 1  US MFM OB LIMITED                     76815.01    RUUR[place] PIELS

Indications

 Abdominal pain in pregnancy
 32 weeks gestation of pregnancy
Fetal Evaluation

 Num Of Fetuses:          1
 Fetal Heart Rate(bpm):   150
 Cardiac Activity:        Observed
 Presentation:            Cephalic
 Placenta:                Anterior Left
 P. Cord Insertion:       Visualized, central

 Amniotic Fluid
 AFI FV:      Within normal limits

 AFI Sum(cm)     %Tile       Largest Pocket(cm)
 20.5            77

 RUQ(cm)       RLQ(cm)       LUQ(cm)        LLQ(cm)
 4.1           5             7.4            4

 Comment:    No placental abruption or previa identified.
OB History

 Gravidity:    2         Term:   1
 Living:       1
Gestational Age

 Best:          32w 6d     Det. By:  Early Ultrasound         EDD:   05/19/21
                                     (10/01/20)
Cervix Uterus Adnexa

 Cervix
 Not visualized (advanced GA >54wks)

 Right Ovary
 Previously seen

 Left Ovary
 Previously seen.
Impression

 Patient was evaluated for abdominal pain .

 A limited ultrasound study was performed .Amniotic fluid is
 normal and good fetal activity is seen .Placenta appears
 normal. The cervix could not be assessed on transabdominal
 scan and transvaginal ultrasound is not indicated (32 weeks).
                 S[place], Muraru

## 2023-06-26 LAB — MISC LABCORP TEST (SEND OUT): Labcorp test code: 83935

## 2023-07-10 ENCOUNTER — Emergency Department (HOSPITAL_COMMUNITY): Admission: EM | Admit: 2023-07-10 | Discharge: 2023-07-10 | Discharge status: Against Medical Advice | Payer: MEDICAID

## 2023-07-10 ENCOUNTER — Other Ambulatory Visit: Payer: Self-pay

## 2023-07-10 ENCOUNTER — Encounter (HOSPITAL_COMMUNITY): Payer: Self-pay

## 2023-07-10 DIAGNOSIS — Z5321 Procedure and treatment not carried out due to patient leaving prior to being seen by health care provider: Secondary | ICD-10-CM | POA: Diagnosis not present

## 2023-07-10 DIAGNOSIS — F430 Acute stress reaction: Secondary | ICD-10-CM | POA: Insufficient documentation

## 2023-07-10 NOTE — ED Notes (Signed)
 Pt stated that her mom made her come denies SI and HI. States that she does not feel like she needs to be here. Pt left before [facility]

## 2023-07-10 NOTE — ED Triage Notes (Signed)
 Pt states she is here for a psych evaluation. Mother sent pt here bc thats been under a lot of stress. Pt denies SI and HI.

## 2023-07-12 ENCOUNTER — Encounter (HOSPITAL_COMMUNITY): Payer: Self-pay

## 2023-07-12 ENCOUNTER — Emergency Department (HOSPITAL_COMMUNITY)
Admission: EM | Admit: 2023-07-12 | Discharge: 2023-07-13 | Disposition: A | Discharge status: Other Acute Inpatient Hospital | Payer: MEDICAID | Attending: Emergency Medicine | Admitting: Emergency Medicine

## 2023-07-12 ENCOUNTER — Other Ambulatory Visit: Payer: Self-pay

## 2023-07-12 DIAGNOSIS — R456 Violent behavior: Secondary | ICD-10-CM | POA: Diagnosis present

## 2023-07-12 DIAGNOSIS — Z79899 Other long term (current) drug therapy: Secondary | ICD-10-CM | POA: Diagnosis not present

## 2023-07-12 DIAGNOSIS — F22 Delusional disorders: Secondary | ICD-10-CM | POA: Diagnosis not present

## 2023-07-12 DIAGNOSIS — F332 Major depressive disorder, recurrent severe without psychotic features: Secondary | ICD-10-CM | POA: Diagnosis not present

## 2023-07-12 DIAGNOSIS — F32A Depression, unspecified: Secondary | ICD-10-CM | POA: Insufficient documentation

## 2023-07-12 LAB — HCG, SERUM, QUALITATIVE: Preg, Serum: NEGATIVE

## 2023-07-12 LAB — CBC WITH DIFFERENTIAL/PLATELET
Abs Immature Granulocytes: 0.02 10*3/uL (ref 0.00–0.07)
Basophils Absolute: 0 10*3/uL (ref 0.0–0.1)
Basophils Relative: 1 %
Eosinophils Absolute: 0.1 10*3/uL (ref 0.0–0.5)
Eosinophils Relative: 1 %
HCT: 37.6 % (ref 36.0–46.0)
Hemoglobin: 12.2 g/dL (ref 12.0–15.0)
Immature Granulocytes: 0 %
Lymphocytes Relative: 35 %
Lymphs Abs: 2.2 10*3/uL (ref 0.7–4.0)
MCH: 28.5 pg (ref 26.0–34.0)
MCHC: 32.4 g/dL (ref 30.0–36.0)
MCV: 87.9 fL (ref 80.0–100.0)
Monocytes Absolute: 0.4 10*3/uL (ref 0.1–1.0)
Monocytes Relative: 7 %
Neutro Abs: 3.6 10*3/uL (ref 1.7–7.7)
Neutrophils Relative %: 56 %
Platelets: 238 10*3/uL (ref 150–400)
RBC: 4.28 MIL/uL (ref 3.87–5.11)
RDW: 14.8 % (ref 11.5–15.5)
WBC: 6.3 10*3/uL (ref 4.0–10.5)
nRBC: 0 % (ref 0.0–0.2)

## 2023-07-12 LAB — COMPREHENSIVE METABOLIC PANEL WITH GFR
ALT: 26 U/L (ref 0–44)
AST: 21 U/L (ref 15–41)
Albumin: 3.7 g/dL (ref 3.5–5.0)
Alkaline Phosphatase: 34 U/L — ABNORMAL LOW (ref 38–126)
Anion gap: 5 (ref 5–15)
BUN: 10 mg/dL (ref 6–20)
CO2: 26 mmol/L (ref 22–32)
Calcium: 9 mg/dL (ref 8.9–10.3)
Chloride: 103 mmol/L (ref 98–111)
Creatinine, Ser: 0.51 mg/dL (ref 0.44–1.00)
GFR, Estimated: >60 mL/min (ref >60)
Glucose, Bld: 87 mg/dL (ref 70–99)
Potassium: 3.8 mmol/L (ref 3.5–5.1)
Sodium: 134 mmol/L — ABNORMAL LOW (ref 135–145)
Total Bilirubin: 0.3 mg/dL (ref 0.0–1.2)
Total Protein: 6.8 g/dL (ref 6.5–8.1)

## 2023-07-12 LAB — ETHANOL: Alcohol, Ethyl (B): <15 mg/dL (ref <15)

## 2023-07-12 MED ORDER — ALUM & MAG HYDROXIDE-SIMETH 200-200-20 MG/5ML PO SUSP: 30.0000 mL | Freq: Four times a day (QID) | ORAL | Status: DC | PRN

## 2023-07-12 MED ORDER — ONDANSETRON HCL 4 MG PO TABS: 4.0000 mg | ORAL_TABLET | Freq: Three times a day (TID) | ORAL | Status: DC | PRN

## 2023-07-12 MED ORDER — MELATONIN 3 MG PO TABS
3.0000 mg | ORAL_TABLET | Freq: Every day | ORAL | Status: DC
Start: 2023-07-12 — End: 2023-07-13

## 2023-07-12 MED ORDER — IBUPROFEN 200 MG PO TABS: 600.0000 mg | ORAL_TABLET | Freq: Three times a day (TID) | ORAL | Status: DC | PRN

## 2023-07-12 NOTE — ED Triage Notes (Signed)
 Pt brought in by [employer] with IVC orders/papers because she has been angry lately and yelling and lashing out at mother. Pt does admit to having high stress level and reports she has always had anger management issues but they have worsened recently. Pt is cooperative and pleasant at this time. Behavioral Health process explained at length and pt expresses verbal understanding

## 2023-07-12 NOTE — ED Notes (Signed)
 Two (2) bags of patient belongings were placed in the "hall B" cabinet.
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
## 2023-07-12 NOTE — ED Notes (Signed)
 SAVE RED TUBE IN MAIN LAB

## 2023-07-13 ENCOUNTER — Encounter (HOSPITAL_COMMUNITY): Payer: Self-pay | Admitting: Nurse Practitioner

## 2023-07-13 ENCOUNTER — Inpatient Hospital Stay (HOSPITAL_COMMUNITY)
Admission: AD | Admit: 2023-07-13 | Discharge: 2023-07-18 | DRG: 885 | Disposition: A | Discharge status: Home / Self Care | Payer: MEDICAID | Source: Intra-hospital | Attending: Psychiatry | Admitting: Psychiatry

## 2023-07-13 DIAGNOSIS — F29 Unspecified psychosis not due to a substance or known physiological condition: Secondary | ICD-10-CM | POA: Diagnosis present

## 2023-07-13 DIAGNOSIS — Z560 Unemployment, unspecified: Secondary | ICD-10-CM | POA: Diagnosis not present

## 2023-07-13 DIAGNOSIS — Z5902 Unsheltered homelessness: Secondary | ICD-10-CM

## 2023-07-13 DIAGNOSIS — Z79899 Other long term (current) drug therapy: Secondary | ICD-10-CM

## 2023-07-13 DIAGNOSIS — E559 Vitamin D deficiency, unspecified: Secondary | ICD-10-CM | POA: Diagnosis present

## 2023-07-13 DIAGNOSIS — F1290 Cannabis use, unspecified, uncomplicated: Secondary | ICD-10-CM | POA: Insufficient documentation

## 2023-07-13 DIAGNOSIS — F332 Major depressive disorder, recurrent severe without psychotic features: Principal | ICD-10-CM | POA: Diagnosis present

## 2023-07-13 DIAGNOSIS — F319 Bipolar disorder, unspecified: Principal | ICD-10-CM | POA: Diagnosis present

## 2023-07-13 DIAGNOSIS — Z5982 Transportation insecurity: Secondary | ICD-10-CM

## 2023-07-13 DIAGNOSIS — F1210 Cannabis abuse, uncomplicated: Secondary | ICD-10-CM | POA: Diagnosis present

## 2023-07-13 DIAGNOSIS — Z818 Family history of other mental and behavioral disorders: Secondary | ICD-10-CM

## 2023-07-13 DIAGNOSIS — F3110 Bipolar disorder, current episode manic without psychotic features, unspecified: Secondary | ICD-10-CM | POA: Diagnosis not present

## 2023-07-13 DIAGNOSIS — Z9152 Personal history of nonsuicidal self-harm: Secondary | ICD-10-CM | POA: Diagnosis not present

## 2023-07-13 DIAGNOSIS — F419 Anxiety disorder, unspecified: Secondary | ICD-10-CM | POA: Diagnosis present

## 2023-07-13 DIAGNOSIS — Z9151 Personal history of suicidal behavior: Secondary | ICD-10-CM | POA: Diagnosis not present

## 2023-07-13 DIAGNOSIS — D563 Thalassemia minor: Secondary | ICD-10-CM | POA: Diagnosis present

## 2023-07-13 HISTORY — DX: Bipolar disorder, unspecified: F31.9

## 2023-07-13 LAB — RAPID URINE DRUG SCREEN, HOSP PERFORMED
Amphetamines: POSITIVE — AB
Barbiturates: NOT DETECTED
Benzodiazepines: NOT DETECTED
Cocaine: POSITIVE — AB
Opiates: NOT DETECTED
Tetrahydrocannabinol: POSITIVE — AB

## 2023-07-13 MED ORDER — HYDROXYZINE HCL 25 MG PO TABS
25.0000 mg | ORAL_TABLET | Freq: Three times a day (TID) | ORAL | Status: DC | PRN
  Administered 2023-07-13 – 2023-07-14 (×2): 25 mg via ORAL
  Filled 2023-07-13 (×2): qty 1

## 2023-07-13 MED ORDER — ESCITALOPRAM OXALATE 10 MG PO TABS
10.0000 mg | ORAL_TABLET | Freq: Every day | ORAL | Status: DC
  Administered 2023-07-14: 10 mg via ORAL
  Filled 2023-07-13: qty 1

## 2023-07-13 MED ORDER — ONDANSETRON HCL 4 MG PO TABS: 4.0000 mg | ORAL_TABLET | Freq: Three times a day (TID) | ORAL | Status: DC | PRN

## 2023-07-13 MED ORDER — ESCITALOPRAM OXALATE 10 MG PO TABS: 10.0000 mg | ORAL_TABLET | Freq: Every day | ORAL | Status: DC

## 2023-07-13 MED ORDER — OXCARBAZEPINE 150 MG PO TABS
150.0000 mg | ORAL_TABLET | Freq: Every day | ORAL | Status: DC
  Administered 2023-07-13: 150 mg via ORAL
  Filled 2023-07-13: qty 1

## 2023-07-13 MED ORDER — HYDROXYZINE HCL 25 MG PO TABS: 25.0000 mg | ORAL_TABLET | Freq: Three times a day (TID) | ORAL | Status: DC | PRN

## 2023-07-13 MED ORDER — OXCARBAZEPINE 150 MG PO TABS: 150.0000 mg | ORAL_TABLET | Freq: Every day | ORAL | Status: DC

## 2023-07-13 MED ORDER — HALOPERIDOL LACTATE 5 MG/ML IJ SOLN: 10.0000 mg | Freq: Three times a day (TID) | INTRAMUSCULAR | Status: DC | PRN

## 2023-07-13 MED ORDER — ACETAMINOPHEN 325 MG PO TABS: 650.0000 mg | ORAL_TABLET | Freq: Four times a day (QID) | ORAL | Status: DC | PRN

## 2023-07-13 MED ORDER — IBUPROFEN 600 MG PO TABS: 600.0000 mg | ORAL_TABLET | Freq: Three times a day (TID) | ORAL | Status: DC | PRN

## 2023-07-13 MED ORDER — HALOPERIDOL 5 MG PO TABS: 5.0000 mg | ORAL_TABLET | Freq: Three times a day (TID) | ORAL | Status: DC | PRN

## 2023-07-13 MED ORDER — MELATONIN 3 MG PO TABS
3.0000 mg | ORAL_TABLET | Freq: Every day | ORAL | Status: DC
  Administered 2023-07-13 – 2023-07-17 (×5): 3 mg via ORAL
  Filled 2023-07-13 (×6): qty 1

## 2023-07-13 MED ORDER — DIPHENHYDRAMINE HCL 25 MG PO CAPS: 50.0000 mg | ORAL_CAPSULE | Freq: Three times a day (TID) | ORAL | Status: DC | PRN

## 2023-07-13 MED ORDER — DIPHENHYDRAMINE HCL 50 MG/ML IJ SOLN: 50.0000 mg | Freq: Three times a day (TID) | INTRAMUSCULAR | Status: DC | PRN

## 2023-07-13 MED ORDER — LORAZEPAM 2 MG/ML IJ SOLN: 2.0000 mg | Freq: Three times a day (TID) | INTRAMUSCULAR | Status: DC | PRN

## 2023-07-13 MED ORDER — ALUM & MAG HYDROXIDE-SIMETH 200-200-20 MG/5ML PO SUSP: 30.0000 mL | Freq: Four times a day (QID) | ORAL | Status: DC | PRN

## 2023-07-13 MED ORDER — HALOPERIDOL LACTATE 5 MG/ML IJ SOLN: 5.0000 mg | Freq: Three times a day (TID) | INTRAMUSCULAR | Status: DC | PRN
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
## 2023-07-13 NOTE — ED Notes (Signed)
 [facility] transport contacted at this time

## 2023-07-13 NOTE — [facility] Group Notes (Signed)
 [facility] Group Notes:  (Nursing/MHT/Case Management/Adjunct)  Date:  07/13/2023  Time:  9:23 PM  Type of Therapy:  Psychoeducational Skills  Participation Level:  Active  Participation Quality:  Attentive  Affect:  Flat  Cognitive:  Appropriate  Insight:  Improving  Engagement in Group:  Improving  Modes of Intervention:  Education  Summary of Progress/Problems: Patient attended the evening A.A. speaker's meeting and was appropriate. Patient shared appropriately with the group.   Dillen Belmontes S 07/13/2023, 9:23 PM

## 2023-07-13 NOTE — Tx Team (Signed)
 Initial Treatment Plan 07/13/2023 4:48 PM Kiara Moreno WUJ:[id number]    PATIENT STRESSORS: Marital or family conflict   Substance abuse     PATIENT STRENGTHS: Average or above average intelligence    PATIENT IDENTIFIED PROBLEMS: Substance use disorder   Depression   Anger   Anxiety   Anger             DISCHARGE CRITERIA:  Withdrawal symptoms are absent or subacute and managed without 24-hour nursing intervention  PRELIMINARY DISCHARGE PLAN: Placement in alternative living arrangements  PATIENT/FAMILY INVOLVEMENT: This treatment plan has been presented to and reviewed with the patient, Kiara Moreno, has been given the opportunity to ask questions and make suggestions.  Jeoffrey Mole, RN 07/13/2023, 4:48 PM

## 2023-07-13 NOTE — Progress Notes (Signed)
 Pt has been accepted to [facility] on 07/13/2023 . Bed assignment: 303-2  Pt meets inpatient criteria per Arvell Latin, NP  Attending Physician will be Dr. Zouev  Report can be called to: Adult unit: [phone]  Pt can arrive after discharges   Care Team Notified: [facility] Bevin Bucks, RN, Loreli Rogue, RN, Alvis Jourdain, NP

## 2023-07-13 NOTE — BH Assessment (Signed)
 TTS consult will be completed by IRIS. IRIS Coordinator will communicate in established secure chat assessment time and provider name. Thanks
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
## 2023-07-13 NOTE — BH Assessment (Addendum)
@  0430 TTS attempted to complete assessment. Per Bambi Lever, RN, patient is asleep, unable to arouse.

## 2023-07-13 NOTE — ED Notes (Signed)
 Pt said she is unable to use the restroom at this time, will try later.

## 2023-07-13 NOTE — Plan of Care (Signed)
   Problem: Activity: Goal: Interest or engagement in activities will improve Outcome: Progressing   Problem: Coping: Goal: Ability to verbalize frustrations and anger appropriately will improve Outcome: Progressing   Problem: Safety: Goal: Periods of time without injury will increase Outcome: Progressing

## 2023-07-13 NOTE — ED Notes (Signed)
 Pts belongings returned to PD for transport

## 2023-07-14 DIAGNOSIS — F3110 Bipolar disorder, current episode manic without psychotic features, unspecified: Principal | ICD-10-CM

## 2023-07-14 DIAGNOSIS — F1290 Cannabis use, unspecified, uncomplicated: Secondary | ICD-10-CM | POA: Insufficient documentation

## 2023-07-14 LAB — TSH: TSH: 2.354 u[IU]/mL (ref 0.350–4.500)

## 2023-07-14 MED ORDER — ARIPIPRAZOLE 15 MG PO TABS
7.5000 mg | ORAL_TABLET | Freq: Every day | ORAL | Status: AC
  Administered 2023-07-14: 7.5 mg via ORAL
  Filled 2023-07-14: qty 1

## 2023-07-14 MED ORDER — TRAZODONE HCL 50 MG PO TABS
50.0000 mg | ORAL_TABLET | Freq: Every evening | ORAL | Status: DC | PRN
  Administered 2023-07-16 – 2023-07-17 (×2): 50 mg via ORAL
  Filled 2023-07-14 (×2): qty 1

## 2023-07-14 MED ORDER — ARIPIPRAZOLE 10 MG PO TABS
10.0000 mg | ORAL_TABLET | Freq: Every day | ORAL | Status: DC
  Administered 2023-07-15 – 2023-07-16 (×2): 10 mg via ORAL
  Filled 2023-07-14 (×2): qty 1

## 2023-07-14 NOTE — Group Note (Signed)
 Recreation Therapy Group Note   Group Topic:Animal Assisted Therapy   Group Date: 07/14/2023 Start Time: 1610 End Time: 1030 Facilitators: Jasmeen Fritsch-McCall, LRT,CTRS Location: [address] Hall Dayroom   Animal-Assisted Activity (AAA) Program Checklist/Progress Notes Patient Eligibility Criteria Checklist & Daily Group note for Rec Tx Intervention  AAA/T Program Assumption of Risk Form signed by Patient/ or Parent Legal Guardian Yes  Patient understands his/her participation is voluntary Yes  Behavioral Response:    Education: Charity fundraiser, Appropriate Animal Interaction   Education Outcome: Acknowledges education.    Affect/Mood: N/A   Participation Level: Did not attend    Clinical Observations/Individualized Feedback:     Plan: Continue to engage patient in RT group sessions 2-3x/week.   Kiara Moreno, LRT,CTRS 07/14/2023 11:27 AM
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
## 2023-07-14 NOTE — Group Note (Signed)
 Date:  07/14/2023 Time:  8:34 PM  Group Topic/Focus:  Wrap-Up Group:   The focus of this group is to help patients review their daily goal of treatment and discuss progress on daily workbooks.    Participation Level:  Did Not Attend  Participation Quality:  N/A  Affect:  N/A  Cognitive:  N/A  Insight: None  Engagement in Group:  N/A  Modes of Intervention:  N/A  Additional Comments:  Patient did not attend wrap up group.   Shannyn Jankowiak 07/14/2023, 8:34 PM

## 2023-07-14 NOTE — Plan of Care (Signed)
  Problem: Education: Goal: Emotional status will improve Outcome: Progressing Goal: Verbalization of understanding the information provided will improve Outcome: Progressing   Problem: Activity: Goal: Sleeping patterns will improve Outcome: Progressing   Problem: Coping: Goal: Ability to demonstrate self-control will improve Outcome: Progressing   Problem: Health Behavior/Discharge Planning: Goal: Compliance with treatment plan for underlying cause of condition will improve Outcome: Progressing

## 2023-07-14 NOTE — Progress Notes (Signed)
   07/13/23 2232  Psych Admission Type (Psych Patients Only)  Admission Status Involuntary  Psychosocial Assessment  Patient Complaints None  Eye Contact Fair  Facial Expression Animated;Anxious  Affect Anxious  Speech Logical/coherent  Interaction Assertive  Motor Activity Other (Comment) (wdl)  Appearance/Hygiene Unremarkable  Behavior Characteristics Anxious  Mood Preoccupied;Anxious  Thought Process  Coherency WDL  Content WDL  Delusions None reported or observed  Perception WDL  Judgment Poor  Confusion WDL  Danger to Self  Current suicidal ideation? Denies  Danger to Others  Danger to Others None reported or observed

## 2023-07-14 NOTE — Progress Notes (Signed)
   07/14/23 1000  Psych Admission Type (Psych Patients Only)  Admission Status Involuntary  Psychosocial Assessment  Patient Complaints None  Eye Contact Fair  Facial Expression Anxious  Affect Anxious;Irritable  Speech Logical/coherent  Interaction Assertive  Motor Activity Fidgety  Appearance/Hygiene Unremarkable  Behavior Characteristics Anxious  Mood Anxious;Irritable  Thought Process  Coherency WDL  Content WDL  Delusions None reported or observed  Perception WDL  Hallucination None reported or observed  Judgment Poor  Confusion WDL  Danger to Self  Current suicidal ideation? Denies  Danger to Others  Danger to Others None reported or observed

## 2023-07-14 NOTE — Group Note (Signed)
 LCSW Group Therapy Note   Group Date: 07/14/2023 Start Time: 1100 End Time: 1200  Participation:  patient was present   Type of Therapy:  Group Therapy  Topic:  "Money Matters: Ecologist, Confidence and Peace of Mind"  Objective: To help participants understand the impact of financial stability on well-being through the lens of Maslow's Hierarchy of Needs and develop practical strategies for budgeting, saving, and debt repayment.  Goals: Increase awareness of spending habits and financial priorities, recognizing how money supports basic needs, security, and relationships. Develop simple budgeting and saving strategies to enhance stability and peace of mind.  Reduce financial stress by creating a realistic debt repayment plan, supporting long-term confidence and well-being.  Summary:  Participants explored how financial stability connects to basic needs, relationships, and self-esteem using Maslow's Hierarchy. They discussed budgeting, saving, and debt repayment strategies, identifying small, manageable changes. Through interactive discussion and self-reflection, they gained insight into their financial habits and created personal action steps for improvement.  Therapeutic Modalities Used: Elements of Cognitive Behavioral Therapy (CBT) - Addressing financial stress and thought patterns. Psychoeducation - Engineer, agricultural. Elements of Motivational Interviewing (MI) - Encouraging realistic, achievable changes. Group Support - Reducing shame and stress through shared experiences.   Kiara Moreno O Tonye Tancredi, LCSWA 07/14/2023  4:32 PM

## 2023-07-14 NOTE — Group Note (Signed)
 Date:  07/14/2023 Time:  9:12 AM  Group Topic/Focus:  Goals Group:   The focus of this group is to help patients establish daily goals to achieve during treatment and discuss how the patient can incorporate goal setting into their daily lives to aide in recovery. Orientation:   The focus of this group is to educate the patient on the purpose and policies of crisis stabilization and provide a format to answer questions about their admission.  The group details unit policies and expectations of patients while admitted.    Participation Level:  Did Not Attend

## 2023-07-14 NOTE — Plan of Care (Signed)

## 2023-07-15 ENCOUNTER — Encounter (HOSPITAL_COMMUNITY): Payer: Self-pay

## 2023-07-15 MED ORDER — LITHIUM CARBONATE 300 MG PO CAPS
300.0000 mg | ORAL_CAPSULE | Freq: Three times a day (TID) | ORAL | Status: DC
  Administered 2023-07-15 – 2023-07-18 (×10): 300 mg via ORAL
  Filled 2023-07-15 (×10): qty 1

## 2023-07-15 NOTE — Plan of Care (Signed)
  Problem: Education: Goal: Knowledge of [facility] Education information/materials will improve Outcome: Progressing Goal: Mental status will improve Outcome: Progressing   Problem: Activity: Goal: Interest or engagement in activities will improve Outcome: Progressing   Problem: Coping: Goal: Ability to verbalize frustrations and anger appropriately will improve Outcome: Progressing

## 2023-07-15 NOTE — Group Note (Unsigned)
 Therapy Group Note  Group Topic:Other  Group Date: 07/15/2023 Start Time: 1430 End Time: 1518 Facilitators: Filip Luten G, OT    Group OT session titled "Media Detox Challenge" was conducted with approximately 20 adolescent patients in the inpatient behavioral health unit. The group focused on exploring the impact of media/screen use on emotional regulation, occupational balance, and routine development. Patients were provided with structured handouts and guided through a step-by-step process that included individual reflection, small group planning, and large group discussion. Tasks included identifying current screen time habits, emotional effects of media use, and collaboratively designing a 24-hour "media detox" plan with non-screen-based alternatives.      Participation Level: {OT B[facility] Participation RUEAV:40981}   Participation Quality: {OT [facility] Participation Quality:26268}   Behavior: {BHH OT Group Behavior:26269}   Speech/Thought Process: {BHH OT Speech/Thought Process:26270}   Affect/Mood: {OT BHH Affect/Mood:26271}   Insight: {OT BHH Insight:26272}   Judgement: {OT BHH Judgement:26272}   Individualization: *** was *** in their participation of group discussion/activity. *** identified  Modes of Intervention: {BHH MODES OF INTERVENTION:26273}  Patient Response to Interventions:  {BHH OT Patient Response to Interventions:26274}   Plan: Continue to engage patient in OT groups 2 - 3x/week.  07/15/2023  Lynnda Sas, OT

## 2023-07-15 NOTE — BH IP Treatment Plan (Signed)
 Interdisciplinary Treatment and Diagnostic Plan Update  07/15/2023 Time of Session: 1050 Kiara Moreno MRN: [id number]  Principal Diagnosis: Bipolar I disorder, most recent episode (or current) manic (HCC)  Secondary Diagnoses: Principal Problem:   Bipolar I disorder, most recent episode (or current) manic (HCC) Active Problems:   Cannabis use disorder   Current Medications:  Current Facility-Administered Medications  Medication Dose Route Frequency Provider Last Rate Last Admin   acetaminophen  (TYLENOL ) tablet 650 mg  650 mg Oral Q6H PRN Michel Agreste, Josephine C, NP       alum & mag hydroxide-simeth (MAALOX/MYLANTA) 200-200-20 MG/5ML suspension 30 mL  30 mL Oral Q6H PRN Michel Agreste, Josephine C, NP       ARIPiprazole (ABILIFY) tablet 10 mg  10 mg Oral Daily Chien, Stephanie, MD   10 mg at 07/15/23 1113   haloperidol (HALDOL) tablet 5 mg  5 mg Oral TID PRN Onuoha, Josephine C, NP       And   diphenhydrAMINE  (BENADRYL ) capsule 50 mg  50 mg Oral TID PRN Onuoha, Josephine C, NP       haloperidol lactate (HALDOL) injection 5 mg  5 mg Intramuscular TID PRN Onuoha, Josephine C, NP       And   diphenhydrAMINE  (BENADRYL ) injection 50 mg  50 mg Intramuscular TID PRN Onuoha, Josephine C, NP       And   LORazepam (ATIVAN) injection 2 mg  2 mg Intramuscular TID PRN Onuoha, Josephine C, NP       haloperidol lactate (HALDOL) injection 10 mg  10 mg Intramuscular TID PRN Onuoha, Josephine C, NP       And   diphenhydrAMINE  (BENADRYL ) injection 50 mg  50 mg Intramuscular TID PRN Onuoha, Josephine C, NP       And   LORazepam (ATIVAN) injection 2 mg  2 mg Intramuscular TID PRN Onuoha, Josephine C, NP       hydrOXYzine  (ATARAX ) tablet 25 mg  25 mg Oral TID PRN Onuoha, Josephine C, NP   25 mg at 07/14/23 2122   ibuprofen  (ADVIL ) tablet 600 mg  600 mg Oral Q8H PRN Onuoha, Josephine C, NP       lithium carbonate capsule 300 mg  300 mg Oral TID WC Chien, Stephanie, MD   300 mg at 07/15/23 1205   melatonin tablet 3 mg   3 mg Oral QHS Onuoha, Josephine C, NP   3 mg at 07/14/23 2122   ondansetron  (ZOFRAN ) tablet 4 mg  4 mg Oral Q8H PRN Onuoha, Josephine C, NP       traZODone (DESYREL) tablet 50 mg  50 mg Oral QHS PRN Chien, Stephanie, MD       PTA Medications: No medications prior to admission.    Patient Stressors: Marital or family conflict   Substance abuse    Patient Strengths: Average or above average intelligence   Treatment Modalities: Medication Management, Group therapy, Case management,  1 to 1 session with clinician, Psychoeducation, Recreational therapy.   Physician Treatment Plan for Primary Diagnosis: Bipolar I disorder, most recent episode (or current) manic (HCC) Long Term Goal(s): Improvement in symptoms so as ready for discharge   Short Term Goals: Ability to identify changes in lifestyle to reduce recurrence of condition will improve Ability to demonstrate self-control will improve Ability to identify and develop effective coping behaviors will improve Compliance with prescribed medications will improve  Medication Management: Evaluate patient's response, side effects, and tolerance of medication regimen.  Therapeutic Interventions: 1 to 1 sessions, Unit Group  sessions and Medication administration.  Evaluation of Outcomes: Not Progressing  Physician Treatment Plan for Secondary Diagnosis: Principal Problem:   Bipolar I disorder, most recent episode (or current) manic (HCC) Active Problems:   Cannabis use disorder  Long Term Goal(s): Improvement in symptoms so as ready for discharge   Short Term Goals: Ability to identify changes in lifestyle to reduce recurrence of condition will improve Ability to demonstrate self-control will improve Ability to identify and develop effective coping behaviors will improve Compliance with prescribed medications will improve     Medication Management: Evaluate patient's response, side effects, and tolerance of medication  regimen.  Therapeutic Interventions: 1 to 1 sessions, Unit Group sessions and Medication administration.  Evaluation of Outcomes: Not Progressing   RN Treatment Plan for Primary Diagnosis: Bipolar I disorder, most recent episode (or current) manic (HCC) Long Term Goal(s): Knowledge of disease and therapeutic regimen to maintain health will improve  Short Term Goals: Ability to remain free from injury will improve, Ability to verbalize frustration and anger appropriately will improve, Ability to demonstrate self-control, Ability to participate in decision making will improve, Ability to verbalize feelings will improve, Ability to disclose and discuss suicidal ideas, Ability to identify and develop effective coping behaviors will improve, and Compliance with prescribed medications will improve  Medication Management: RN will administer medications as ordered by provider, will assess and evaluate patient's response and provide education to patient for prescribed medication. RN will report any adverse and/or side effects to prescribing provider.  Therapeutic Interventions: 1 on 1 counseling sessions, Psychoeducation, Medication administration, Evaluate responses to treatment, Monitor vital signs and CBGs as ordered, Perform/monitor CIWA, COWS, AIMS and Fall Risk screenings as ordered, Perform wound care treatments as ordered.  Evaluation of Outcomes: Not Progressing   LCSW Treatment Plan for Primary Diagnosis: Bipolar I disorder, most recent episode (or current) manic (HCC) Long Term Goal(s): Safe transition to appropriate next level of care at discharge, Engage patient in therapeutic group addressing interpersonal concerns.  Short Term Goals: Engage patient in aftercare planning with referrals and resources, Increase social support, Increase ability to appropriately verbalize feelings, Increase emotional regulation, Facilitate acceptance of mental health diagnosis and concerns, Facilitate patient  progression through stages of change regarding substance use diagnoses and concerns, and Identify triggers associated with mental health/substance abuse issues  Therapeutic Interventions: Assess for all discharge needs, 1 to 1 time with Social worker, Explore available resources and support systems, Assess for adequacy in community support network, Educate family and significant other(s) on suicide prevention, Complete Psychosocial Assessment, Interpersonal group therapy.  Evaluation of Outcomes: Not Progressing   Progress in Treatment: Attending groups: No. Participating in groups: No. Taking medication as prescribed: Yes. Toleration medication: Yes. Family/Significant other contact made: No, will contact:   Vivianne Grosser (mother) [phone] Patient understands diagnosis: Yes. Discussing patient identified problems/goals with staff: Yes. Medical problems stabilized or resolved: Yes. Denies suicidal/homicidal ideation: Yes. Issues/concerns per patient self-inventory: No.  New problem(s) identified: No, Describe:  none  New Short Term/Long Term Goal(s): medication stabilization, elimination of SI thoughts, development of comprehensive mental wellness plan.    Patient Goals:  "Get my head right"  Discharge Plan or Barriers: Patient recently admitted. CSW will continue to follow and assess for appropriate referrals and possible discharge planning.    Reason for Continuation of Hospitalization: Anxiety Mania Medication stabilization  Estimated Length of Stay: 5-7 days  Last 3 [place] Suicide Severity Risk Score: Flowsheet Row [facility]12/2023 in [facility] at [facility] ED from 07/10/2023 in [facility] Emergency Department at [facility]  C-SSRS RISK CATEGORY No Risk No Risk No Risk       Last [facility] 2/9 Scores:    06/20/2021   10:42 AM 04/23/2021    4:50  PM 02/20/2021    8:33 AM  Depression screen PHQ 2/9  Decreased Interest 3 3 3   Down, Depressed, Hopeless 2 0 0  PHQ - 2 Score 5 3 3   Altered sleeping 3 0 3  Tired, decreased energy 3 0 3  Change in appetite 3 3 0  Feeling bad or failure about yourself  3 0 0  Trouble concentrating 1 3 0  Moving slowly or fidgety/restless 3 0 0  Suicidal thoughts 2 0 0  PHQ-9 Score 23 9 9     Scribe for Treatment Team: Vonzell Guerin, LCSWA 07/15/2023 1:05 PM

## 2023-07-15 NOTE — Group Note (Signed)
 Date:  07/15/2023 Time:  9:17 PM  Group Topic/Focus:  Wrap-Up Group:   The focus of this group is to help patients review their daily goal of treatment and discuss progress on daily workbooks.    Participation Level:  Did Not Attend  Participation Quality:  N/A  Affect:  N/A  Cognitive:  N/A  Insight: None  Engagement in Group:  N/A  Modes of Intervention:  N/A  Additional Comments:  Patient did not attend wrap up group.  Alexee Delsanto 07/15/2023, 9:17 PM

## 2023-07-15 NOTE — Progress Notes (Signed)
   07/15/23 0830  Psych Admission Type (Psych Patients Only)  Admission Status Involuntary  Psychosocial Assessment  Patient Complaints None  Eye Contact Fair  Facial Expression Anxious  Affect Appropriate to circumstance  Speech Logical/coherent  Interaction Assertive  Motor Activity Fidgety  Appearance/Hygiene Unremarkable  Behavior Characteristics Cooperative  Mood Anxious  Thought Process  Coherency WDL  Content WDL  Delusions None reported or observed  Perception WDL  Hallucination None reported or observed  Judgment Poor  Confusion None  Danger to Self  Current suicidal ideation? Denies  Danger to Others  Danger to Others None reported or observed
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
## 2023-07-15 NOTE — Group Note (Signed)
 Recreation Therapy Group Note   Group Topic:Communication  Group Date: 07/15/2023 Start Time: 0945 End Time: 1009 Facilitators: Calypso Hagarty-McCall, LRT,CTRS Location: [address] Dayroom   Group Topic: Communication, Problem Solving   Goal Area(s) Addresses:  Patient will effectively listen to complete activity.  Patient will identify communication skills used to make activity successful.  Patient will identify how skills used during activity can be used to reach post d/c goals.    Behavioral Response:   Intervention: Building surveyor Activity - Geometric pattern cards, pencils, blank paper    Activity: Geometric Drawings.  Three volunteers from the peer group will be shown an abstract picture with a particular arrangement of geometrical shapes.  Each round, one 'speaker' will describe the pattern, as accurately as possible without revealing the image to the group.  The remaining group members will listen and draw the picture to reflect how it is described to them. Patients with the role of 'listener' cannot ask clarifying questions but, may request that the speaker repeat a direction. Once the drawings are complete, the presenter will show the rest of the group the picture and compare how close each person came to drawing the picture. LRT will facilitate a post-activity discussion regarding effective communication and the importance of planning, listening, and asking for clarification in daily interactions with others.  Education: Environmental consultant, Active listening, Support systems, Discharge planning  Education Outcome: Acknowledges understanding/In group clarification offered/Needs additional education.    Affect/Mood: N/A   Participation Level: Did not attend    Clinical Observations/Individualized Feedback:     Plan: Continue to engage patient in RT group sessions 2-3x/week.   Matti Killingsworth-McCall, LRT,CTRS 07/15/2023 12:15 PM

## 2023-07-16 ENCOUNTER — Encounter (HOSPITAL_COMMUNITY): Payer: Self-pay | Admitting: Nurse Practitioner

## 2023-07-16 DIAGNOSIS — E538 Deficiency of other specified B group vitamins: Secondary | ICD-10-CM

## 2023-07-16 DIAGNOSIS — E559 Vitamin D deficiency, unspecified: Secondary | ICD-10-CM

## 2023-07-16 HISTORY — DX: Deficiency of other specified B group vitamins: E53.8

## 2023-07-16 HISTORY — DX: Vitamin D deficiency, unspecified: E55.9

## 2023-07-16 LAB — RPR
RPR Ser Ql: REACTIVE — AB
RPR Titer: 1:1 {titer}

## 2023-07-16 LAB — VITAMIN B12: Vitamin B-12: 671 pg/mL (ref 180–914)

## 2023-07-16 LAB — HEMOGLOBIN A1C
Hgb A1c MFr Bld: 5 % (ref 4.8–5.6)
Mean Plasma Glucose: 96.8 mg/dL

## 2023-07-16 LAB — VITAMIN D 25 HYDROXY (VIT D DEFICIENCY, FRACTURES): Vit D, 25-Hydroxy: 18.67 ng/mL — ABNORMAL LOW (ref 30–100)

## 2023-07-16 LAB — FOLATE: Folate: 5 ng/mL — ABNORMAL LOW (ref >5.9)

## 2023-07-16 LAB — LIPID PANEL
Cholesterol: 239 mg/dL — ABNORMAL HIGH (ref 0–200)
HDL: 62 mg/dL (ref >40)
LDL Cholesterol: 157 mg/dL — ABNORMAL HIGH (ref 0–99)
Total CHOL/HDL Ratio: 3.9 ratio
Triglycerides: 100 mg/dL (ref <150)
VLDL: 20 mg/dL (ref 0–40)

## 2023-07-16 MED ORDER — ARIPIPRAZOLE ER 400 MG IM SRER
400.0000 mg | Freq: Once | INTRAMUSCULAR | Status: AC
  Administered 2023-07-16: 400 mg via INTRAMUSCULAR

## 2023-07-16 MED ORDER — ARIPIPRAZOLE 15 MG PO TABS
15.0000 mg | ORAL_TABLET | Freq: Every day | ORAL | Status: DC
  Administered 2023-07-17 – 2023-07-18 (×2): 15 mg via ORAL
  Filled 2023-07-16 (×2): qty 1

## 2023-07-16 MED ORDER — FOLIC ACID 1 MG PO TABS
1.0000 mg | ORAL_TABLET | Freq: Every day | ORAL | Status: DC
  Administered 2023-07-16 – 2023-07-18 (×3): 1 mg via ORAL
  Filled 2023-07-16 (×3): qty 1

## 2023-07-16 MED ORDER — VITAMIN D (ERGOCALCIFEROL) 1.25 MG (50000 UNIT) PO CAPS
50000.0000 [IU] | ORAL_CAPSULE | Freq: Every day | ORAL | Status: DC
  Administered 2023-07-16 – 2023-07-18 (×3): 50000 [IU] via ORAL
  Filled 2023-07-16 (×3): qty 1

## 2023-07-16 NOTE — Plan of Care (Signed)
 Nurse discussed anxiety, depression and coping skills with patient.

## 2023-07-16 NOTE — Group Note (Signed)
 LCSW Group Therapy Note   Group Date: 07/16/2023 Start Time: 1100 End Time: 1200   Participation:  did not attend  Type of Therapy:  Group Therapy  Topic:  Finding Balance: Using Wise Mind for Thoughtful Decisions  Objective:  To help participants understand and apply the concept of Agustina Aldrich Mind to make balanced, thoughtful decisions by integrating emotion and logic.  Goals: Learn the differences between Emotional Mind, Reasonable Mind, and [employer]. Recognize personal signs of Emotional and Reasonable Mind. Practice using [employer] in real-life scenarios.  Therapeutic Modalities: Elements of Dialectical Behavior Therapy (DBT):  Mindfulness (noticing thoughts and emotions without judgment), Emotion Regulation (understanding and managing emotional responses), Distress Tolerance (coping with difficult situations without making them worse), Wise Mind (integrating emotion and reason for balanced decision-making) Elements of Cognitive Behavioral Therapy (CBT):  Identifying automatic thoughts, Challenging cognitive distortions, Using logic to reframe unhelpful thinking patterns  Summary:  This class focused on Wise Mind - DBT's concept of balancing Emotional Mind and Reasonable Mind. We identified when we're in each state and practiced using Wise Mind to respond thoughtfully in real-life situations. By combining emotion and logic, participants can improve decision-making, manage challenges, and enhance relationships.   Sajid Ruppert O Dmonte Maher, LCSWA 07/16/2023  4:41 PM
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
## 2023-07-16 NOTE — [facility] Group Notes (Signed)
 [facility] Group Notes:  (Nursing/MHT/Case Management/Adjunct)  Date:  07/16/2023  Time:  2015  Type of Therapy:  Wrap up group  Participation Level:  Active  Participation Quality:  Appropriate, Attentive, Sharing, and Supportive  Affect:  Appropriate and Tearful  Cognitive:  Alert  Insight:  Improving  Engagement in Group:  Engaged  Modes of Intervention:  Clarification, Education, and Support  Summary of Progress/Problems: Positive thinking and positive change were discussed.   Catharine Clock 07/16/2023, 9:03 PM

## 2023-07-16 NOTE — Group Note (Signed)
 Date:  07/16/2023 Time:  9:17 AM  Group Topic/Focus:  Goals Group:   The focus of this group is to help patients establish daily goals to achieve during treatment and discuss how the patient can incorporate goal setting into their daily lives to aide in recovery.    Participation Level:  Did Not Attend   Ellan Gunner 07/16/2023, 9:17 AM

## 2023-07-16 NOTE — Progress Notes (Signed)
 D:  Patient denied SI and HI, contracts for safety.  Denied A/V hallucinations. A:  Medications administered per MD orders.  Encouragement and emotional support given. R:  Safety maintained with 15 minute checks.

## 2023-07-16 NOTE — Progress Notes (Signed)
   07/15/23 2140  Psych Admission Type (Psych Patients Only)  Admission Status Involuntary  Psychosocial Assessment  Patient Complaints None  Eye Contact Fair  Facial Expression Anxious  Affect Appropriate to circumstance  Speech Logical/coherent  Interaction Assertive  Motor Activity Fidgety  Appearance/Hygiene Unremarkable  Behavior Characteristics Cooperative  Mood Anxious  Thought Process  Coherency WDL  Content WDL  Delusions None reported or observed  Perception WDL  Hallucination None reported or observed  Judgment Poor  Confusion WDL  Danger to Self  Current suicidal ideation? Denies (Denies)  Danger to Others  Danger to Others None reported or observed

## 2023-07-17 NOTE — Plan of Care (Signed)
  Problem: Education: Goal: Knowledge of [facility] Education information/materials will improve Outcome: Progressing Goal: Mental status will improve Outcome: Progressing Goal: Verbalization of understanding the information provided will improve Outcome: Progressing   Problem: Activity: Goal: Interest or engagement in activities will improve Outcome: Progressing   Problem: Coping: Goal: Ability to verbalize frustrations and anger appropriately will improve Outcome: Progressing Goal: Ability to demonstrate self-control will improve Outcome: Progressing   Problem: Health Behavior/Discharge Planning: Goal: Identification of resources available to assist in meeting health care needs will improve Outcome: Progressing Goal: Compliance with treatment plan for underlying cause of condition will improve Outcome: Progressing   Problem: Physical Regulation: Goal: Ability to maintain clinical measurements within normal limits will improve Outcome: Progressing

## 2023-07-17 NOTE — Group Note (Signed)
 Date:  07/17/2023 Time:  9:12 AM  Group Topic/Focus:  Goals Group:   The focus of this group is to help patients establish daily goals to achieve during treatment and discuss how the patient can incorporate goal setting into their daily lives to aide in recovery.    Participation Level:  Did Not Attend   Ellan Gunner 07/17/2023, 9:12 AM

## 2023-07-17 NOTE — Progress Notes (Signed)
   07/17/23 0200  Psych Admission Type (Psych Patients Only)  Admission Status Involuntary  Psychosocial Assessment  Patient Complaints None  Eye Contact Fair  Facial Expression Anxious  Affect Appropriate to circumstance  Speech Logical/coherent  Interaction  (Engaged in peers)  Motor Activity Other (Comment) (WNL)  Appearance/Hygiene UTA  Behavior Characteristics Cooperative  Mood Other (Comment) (Appropiate)  Thought Process  Coherency WDL  Content WDL  Delusions None reported or observed  Perception WDL  Hallucination None reported or observed  Judgment UTA  Confusion WDL  Danger to Self  Current suicidal ideation? Denies  Danger to Others  Danger to Others None reported or observed

## 2023-07-17 NOTE — Group Note (Signed)
 Date:  07/17/2023 Time:  8:56 PM  Group Topic/Focus:  Wrap-Up Group:   The focus of this group is to help patients review their daily goal of treatment and discuss progress on daily workbooks.    Additional Comments:  Pt was encouraged, but opted out of attending the AA meeting this evening.   Eligah Grow 07/17/2023, 8:56 PM

## 2023-07-17 NOTE — Group Note (Signed)
 Date:  07/17/2023 Time:  9:36 PM  Group Topic/Focus:  Wrap-Up Group:   The focus of this group is to help patients review their daily goal of treatment and discuss progress on daily workbooks.    Participation Level:  Active  Participation Quality:  Appropriate  Affect:  Appropriate  Cognitive:  Appropriate  Insight: Appropriate  Engagement in Group:  Engaged  Modes of Intervention:  Discussion  Additional Comments:  Pt had a good day  Dellia Ferguson 07/17/2023, 9:36 PM
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
## 2023-07-17 NOTE — [facility] Suicide Risk Assessment (Signed)
 [facility] INPATIENT:  Family/Significant Other Suicide Prevention Education  Suicide Prevention Education:  Education Completed; Kiara Moreno (mother) [phone],  (name of family member/significant other) has been identified by the patient as the family member/significant other with whom the patient will be residing, and identified as the person(s) who will aid the patient in the event of a mental health crisis (suicidal ideations/suicide attempt).  With written consent from the patient, the family member/significant other has been provided the following suicide prevention education, prior to the and/or following the discharge of the patient.  Kiara Moreno reported patient will not have access to firearms,guns, or weapons to harm herself or others. She confirmed she's able to provide support should patient have a mental health crisis. Kiara Moreno reported no safety concerns related to patient returning home for discharge.   The suicide prevention education provided includes the following: Suicide risk factors Suicide prevention and interventions National Suicide Hotline telephone number [facility] assessment telephone number [facility] Emergency Assistance 911 [facility] and/or Residential Mobile Crisis Unit telephone number  Request made of family/significant other to: Remove weapons (e.g., guns, rifles, knives), all items previously/currently identified as safety concern.   Remove drugs/medications (over-the-counter, prescriptions, illicit drugs), all items previously/currently identified as a safety concern.  The family member/significant other verbalizes understanding of the suicide prevention education information provided.  The family member/significant other agrees to remove the items of safety concern listed above.  Kiara Moreno M Kiara Moreno, LCSWA 07/17/2023, 4:24 PM

## 2023-07-17 NOTE — Progress Notes (Signed)
   07/17/23 0800  Psych Admission Type (Psych Patients Only)  Admission Status Involuntary  Psychosocial Assessment  Patient Complaints None  Eye Contact Fair  Facial Expression Anxious  Affect Appropriate to circumstance  Speech Logical/coherent  Interaction Assertive  Motor Activity Other (Comment) (wnl)  Appearance/Hygiene Unremarkable  Behavior Characteristics Cooperative  Mood Anxious  Thought Process  Coherency WDL  Content WDL  Delusions None reported or observed  Perception WDL  Hallucination None reported or observed  Judgment Poor  Confusion WDL  Danger to Self  Current suicidal ideation? Denies  Danger to Others  Danger to Others None reported or observed

## 2023-07-17 NOTE — Plan of Care (Signed)
  Problem: Education: Goal: Knowledge of [facility] Education information/materials will improve Outcome: Progressing Goal: Mental status will improve Outcome: Progressing   Problem: Activity: Goal: Interest or engagement in activities will improve Outcome: Progressing   Problem: Coping: Goal: Ability to verbalize frustrations and anger appropriately will improve Outcome: Progressing

## 2023-07-17 NOTE — Group Note (Signed)
 Recreation Therapy Group Note   Group Topic:Team Building  Group Date: 07/17/2023 Start Time: 0940 End Time: 1005 Facilitators: Luciel Brickman-McCall, LRT,CTRS Location: [address] Dayroom   Group Topic: Communication, Team Building, Problem Solving  Goal Area(s) Addresses:  Patient will effectively work with peer towards shared goal.  Patient will identify skills used to make activity successful.  Patient will identify how skills used during activity can be used to reach post d/c goals.   Behavioral Response: Observed  Intervention: STEM Activity  Activity: Straw Bridge. In teams of 3-5, patients were given 15 plastic drinking straws and an equal length of masking tape. Using the materials provided, patients were instructed to build a free standing bridge-like structure to suspend an everyday item (ex: puzzle box) off of the floor or table surface. All materials were required to be used by the team in their design. LRT facilitated post-activity discussion reviewing team process. Patients were encouraged to reflect how the skills used in this activity can be generalized to daily life post discharge.   Education: Pharmacist, community, Scientist, physiological, Discharge Planning   Education Outcome: Acknowledges education/In group clarification offered/Needs additional education.    Affect/Mood: Appropriate   Participation Level: None   Participation Quality: Independent   Behavior: Appropriate and On-looking   Speech/Thought Process: Relevant   Insight: None   Judgement: None   Modes of Intervention: STEM Activity   Patient Response to Interventions:  Attentive   Education Outcome:  In group clarification offered    Clinical Observations/Individualized Feedback: Pt came in late for last few minutes of group. Pt observed for those last minutes.     Plan: Continue to engage patient in RT group sessions 2-3x/week.   Shaela Boer-McCall, LRT,CTRS 07/17/2023 11:32 AM

## 2023-07-18 DIAGNOSIS — F3110 Bipolar disorder, current episode manic without psychotic features, unspecified: Secondary | ICD-10-CM

## 2023-07-18 LAB — LITHIUM LEVEL: Lithium Lvl: 0.64 mmol/L (ref 0.60–1.20)

## 2023-07-18 MED ORDER — ARIPIPRAZOLE 15 MG PO TABS: 15.0000 mg | ORAL_TABLET | Freq: Every day | ORAL | 0 refills | Status: AC

## 2023-07-18 MED ORDER — FOLIC ACID 1 MG PO TABS: 1.0000 mg | ORAL_TABLET | Freq: Every day | ORAL | 0 refills | Status: AC

## 2023-07-18 MED ORDER — CHOLECALCIFEROL 125 MCG (5000 UT) PO TABS: 1.0000 | ORAL_TABLET | Freq: Every day | ORAL | 0 refills | Status: AC

## 2023-07-18 MED ORDER — LITHIUM CARBONATE 300 MG PO CAPS: 900.0000 mg | ORAL_CAPSULE | Freq: Every day | ORAL | 0 refills | Status: AC

## 2023-07-18 MED ORDER — VITAMIN D (ERGOCALCIFEROL) 1.25 MG (50000 UNIT) PO CAPS: 50000.0000 [IU] | ORAL_CAPSULE | ORAL | 0 refills | Status: AC

## 2023-07-18 MED ORDER — TRAZODONE HCL 50 MG PO TABS: 50.0000 mg | ORAL_TABLET | Freq: Every evening | ORAL | 0 refills | Status: AC | PRN

## 2023-07-18 NOTE — BHH Suicide Risk Assessment (Signed)
 [facility] ([place]) Discharge Suicide Risk Assessment   Principal Problem: Bipolar I disorder, most recent episode (or current) manic (HCC)  Discharge Diagnoses: Principal Problem:   Bipolar I disorder, most recent episode (or current) manic (HCC) Active Problems:   Cannabis use disorder      Total Time spent with patient: 40  Musculoskeletal: Strength & Muscle Tone: within normal limits Gait & Station: normal Patient leans: N/A   Psychiatric Specialty Exam:  Presentation  General Appearance: Appropriate for Environment  Eye Contact: Good  Speech: Clear and Coherent; Normal Rate  Speech Volume: Normal  Handedness: Right   Mood and Affect  Mood: Euthymic  Affect: Congruent   Thought Process  Thought Processes: Linear  Descriptions of Associations: Intact  Orientation: Full (Time, Place and Person)  Thought Content: Logical  History of Schizophrenia/Schizoaffective disorder: No data recorded Duration of Psychotic Symptoms: NA Hallucinations: Hallucinations: None  Ideas of Reference: None  Suicidal Thoughts: Suicidal Thoughts: No  Homicidal Thoughts: Homicidal Thoughts: No   Sensorium  Memory: Immediate Good  Judgment: Fair  Insight: Fair   Art therapist  Concentration: Good  Attention Span: Good  Recall: Good  Fund of Knowledge: Good  Language: Good   Psychomotor Activity  Psychomotor Activity: Psychomotor Activity: Normal   Assets  Assets: Communication Skills; Social Support; Housing; Leisure Time   Sleep  Sleep: Sleep: Good Number of Hours of Sleep: 8   Physical Exam: General: Sitting comfortably. NAD. HEENT: Normocephalic, atraumatic, MMM, EMOI Lungs: no increased work of breathing noted Heart: no cyanosis Abdomen: Non distended Musculoskeletal: FROM. No obvious deformities Skin: Warm, dry, intact. No rashes noted Neuro: No obvious focal deficits.  Gait and station are normal  Review of Systems  Constitutional: Negative.    HENT: Negative.    Eyes: Negative.   Respiratory: Negative.    Cardiovascular: Negative.   Gastrointestinal: Negative.   Genitourinary: Negative.   Skin: Negative.   Neurological: Negative.   Psychiatric/Behavioral:  Negative   Mental Status Per Nursing Assessment: NA  Demographic Factors:  Adolescent or young adult and Low socioeconomic status  Loss Factors: NA  Historical Factors: Impulsivity  Risk Reduction Factors:   Sense of responsibility to family and Positive social support  Continued Clinical Symptoms:  Previous psychiatric diagnoses and treatments  Cognitive Features That Contribute To Risk:  None  Suicide Risk:  Minimal: No identifiable suicidal ideation.  Patients presenting with no risk factors but with morbid ruminations; may be classified as minimal risk based on the severity of the depressive symptoms.    Follow-up Information     [facility] Go on 08/10/2023.   Why: You have an appointment for medication management services on 08/10/23 at 11:00 am, in person. Contact information: [address] [place] [address] [phone]         [facility] Go on 07/21/2023.   Specialty: Professional Counselor Why: Please go to this provider for an assessment on 07/21/23 at 9:00 am, to obtain therapy services.  You may also go Monday through Friday, from 9 am to 1 pm. Contact information: [address] [place]  [address] [place] [address] [phone]                  Plan Of Care/Follow-up recommendations:  Activity: as tolerated  Diet: heart healthy  Other: -Follow-up with your outpatient psychiatric provider -instructions on appointment date, time, and address (location) are provided to you in discharge paperwork.  -Take your psychiatric medications as prescribed at discharge -  instructions are provided to you in the discharge paperwork  -Follow-up with outpatient primary care doctor and other  specialists -for management of preventative medicine and chronic medical issues  -Testing: Follow-up with outpatient provider for any abnormal lab results (if any)  -If you are prescribed an atypical antipsychotic medication, we recommend that your outpatient psychiatrist follow routine screening for side effects within 3 months of discharge, including monitoring: AIMS scale, height, weight, blood pressure, fasting lipid panel, HbA1c, and fasting blood sugar.   -Recommend total abstinence from alcohol, tobacco, and other illicit drug use at discharge.   -If your psychiatric symptoms recur, worsen, or if you have side effects to your psychiatric medications, call your outpatient psychiatric provider, 911, [phone] or go to the nearest emergency department.  -If suicidal thoughts occur, immediately call your outpatient psychiatric provider, 911, [phone] or go to the nearest emergency department.   Clair Crews, MD 07/18/23 11:16 AM

## 2023-07-18 NOTE — Plan of Care (Signed)
   Problem: Education: Goal: Knowledge of [facility] Education information/materials will improve Outcome: Progressing   Problem: Education: Goal: Verbalization of understanding the information provided will improve Outcome: Progressing

## 2023-07-18 NOTE — Group Note (Signed)
 Date:  07/18/2023 Time:  9:04 AM  Group Topic/Focus:  Goals Group:   The focus of this group is to help patients establish daily goals to achieve during treatment and discuss how the patient can incorporate goal setting into their daily lives to aide in recovery.    Participation Level:  Did Not Attend    Vallarie Gauze 07/18/2023, 9:04 AM

## 2023-07-18 NOTE — Discharge Summary (Signed)
 Physician Discharge Summary Note  Patient:  Kiara Moreno is a 23 y.o. female  MRN:  [id number]  DOB:  [birth year]  Patient phone: [phone] (home)  Patient address:   [address] [place] [address]   Total Time spent with patient: 77 Minutes  Date of Admission:  07/13/2023  Date of Discharge: 07/18/23   Reason for Admission: Mania  Principal Problem: Bipolar I disorder, most recent episode (or current) manic (HCC)  Discharge Diagnoses: Principal Problem:   Bipolar I disorder, most recent episode (or current) manic (HCC) Active Problems:   Cannabis use disorder     Past Psychiatric (and medical) History: Kiara Moreno  has a past medical history of Bipolar I disorder (HCC), 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   Past Medical History:  Past Medical History:  Diagnosis Date   Bipolar I disorder (HCC)    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   Medical history non-contributory    Vitamin D  deficiency 07/16/2023     Past Surgical History:  Procedure Laterality Date   arm fracture surgery     NO PAST SURGERIES     WISDOM TOOTH EXTRACTION Bilateral 11/18/2019     Family History:  Family History  Problem Relation Age of Onset   Diabetes Neg Hx    Hypertension Neg Hx      Family Psychiatric  History: See H&P  Social History:  Social History   Substance and Sexual Activity  Alcohol Use Not Currently     Social History   Substance and Sexual Activity  Drug Use Not Currently   Types: Marijuana     Social History   Socioeconomic History   Marital status: Single    Spouse name: Not on file   Number of children: Not on file   Years of education: Not on file   Highest education level: Not on file  Occupational History   Not on file  Tobacco Use   Smoking status: Never   Smokeless tobacco: Never  Vaping Use   Vaping status: Unknown  Substance and Sexual Activity   Alcohol use: Not  Currently   Drug use: Not Currently    Types: Marijuana   Sexual activity: Yes  Other Topics Concern   Not on file  Social History Narrative   Not on file   Social Drivers of Health   Financial Resource Strain: Low Risk  (04/23/2020)   Overall Financial Resource Strain (CARDIA)    Difficulty of Paying Living Expenses: Not hard at all  Food Insecurity: No Food Insecurity (07/13/2023)   Hunger Vital Sign    Worried About Running Out of Food in the Last Year: Never true    Ran Out of Food in the Last Year: Never true  Transportation Needs: Unmet Transportation Needs (07/13/2023)   PRAPARE - Transportation    Lack of Transportation (Medical): Yes    Lack of Transportation (Non-Medical): Yes  Physical Activity: Inactive (04/23/2020)   Exercise Vital Sign    Days of Exercise per Week: 0 days    Minutes of Exercise per Session: 0 min  Stress: Stress Concern Present (04/23/2020)   [employer] of Occupational Health - Occupational Stress Questionnaire    Feeling of Stress : Very much  Social Connections: Unknown (07/02/2022)   Received from [facility] Short Social Needs Screening - Social Connection    Would you like help with any of the following needs: food, medicine/medical supplies, transportation, loneliness, housing or  utilities?: Not on file     Hospital Course:  During the patient's hospitalization, patient had extensive initial psychiatric evaluation, and follow-up psychiatric evaluations every day.  Psychiatric diagnoses provided upon initial assessment: Major depressive disorder, recurrent severe without psychotic features (HCC) [F33.2]   Patient's psychiatric medications were adjusted on admission: Patient was started on lithium  300 mg 3 times daily.  She was discharged on 900 mg nightly to protect the kidneys and thyroid.  Abilify  was started and titrated to 15 mg daily.  She received an Abilify  Maintena 400 mg injection on 5/29.  She will need a follow-up injection  prior to 6/26.  Melatonin 3 mg was prescribed for sleep.  She was started on Folvite  1 mg daily for folate deficiency.  She was started on high-dose vitamin D  replacement 50,000 units daily while in the hospital, and advised to continue this medication weekly after discharge in addition to over-the-counter vitamin D  5000 units daily.  She is advised to follow-up with her PCP for resolution of both conditions.  Patient's care was discussed during the interdisciplinary team meeting every day during the hospitalization.  The patient denied having side effects to prescribed psychiatric medication.  Gradually, patient started adjusting to milieu. The patient was evaluated each day by a clinical provider to ascertain response to treatment. Improvement was noted by the patient's report of decreasing symptoms, improved sleep and appetite, affect, medication tolerance, behavior, and participation in unit programming.  Patient was asked each day to complete a self inventory noting mood, mental status, pain, new symptoms, anxiety and concerns.    Symptoms were reported as significantly decreased or resolved completely by discharge.   On day of discharge, the patient reports that their mood is stable. The patient denied having suicidal thoughts for more than 48 hours prior to discharge.  Patient denies having homicidal thoughts.  Patient denies having auditory hallucinations.  Patient denies any visual hallucinations or other symptoms of psychosis. The patient was motivated to continue taking medication with a goal of continued improvement in mental health.   The patient reports their target psychiatric symptoms of mania responded well to the psychiatric medications and unit programming.  The patient reports overall benefit other psychiatric hospitalization. Supportive psychotherapy was provided to the patient. The patient also participated in regular group therapy while hospitalized. Coping skills, problem solving  as well as relaxation therapies were also part of the unit programming.  Labs were reviewed with the patient, and abnormal results were discussed with the patient.  The patient is able to verbalize their individual safety plan to this provider.    Physical Findings:  AIMS:  Facial and Oral Movements: None Muscles of Facial Expression: None Lips and Perioral Area: None Jaw: None Tongue: None,Extremity Movements Upper (arms, wrists, hands, fingers): None Lower (legs, knees, ankles, toes): None, Trunk Movements Neck, shoulders, hips: None, Global Judgements Severity of abnormal movements overall: None Incapacitation due to abnormal movements: None Patient's awareness of abnormal movements: No Awareness, Dental Status Current problems with teeth and/or dentures: No Does patient usually wear dentures: No [place]: No   CIWA:   NA  COWS:  NA  Musculoskeletal: Strength & Muscle Tone: within normal limits Gait & Station: normal Patient leans: N/A    Psychiatric Specialty Exam:  Presentation  General Appearance: Appropriate for Environment  Eye Contact: Good  Speech: Clear and Coherent; Normal Rate  Speech Volume: Normal  Handedness: Right   Mood and Affect  Mood: Euthymic  Affect: Congruent   Thought Process  Thought Processes: Linear  Descriptions of Associations: Intact  Orientation: Full (Time, Place and Person)  Thought Content: Logical  History of Schizophrenia/Schizoaffective disorder: No data recorded Duration of Psychotic Symptoms: NA Hallucinations: Hallucinations: None  Ideas of Reference: None  Suicidal Thoughts: Suicidal Thoughts: No  Homicidal Thoughts: Homicidal Thoughts: No   Sensorium  Memory: Immediate Good  Judgment: Fair  Insight: Fair   Art therapist  Concentration: Good  Attention Span: Good  Recall: Good  Fund of Knowledge: Good  Language: Good   Psychomotor Activity  Psychomotor Activity: Psychomotor  Activity: Normal   Assets  Assets: Communication Skills; Social Support; Housing; Leisure Time   Sleep  Sleep: Sleep: Good Number of Hours of Sleep: 8      Physical Exam: General: Sitting comfortably. NAD. HEENT: Normocephalic, atraumatic, MMM, EMOI Lungs: no increased work of breathing noted Heart: no cyanosis Abdomen: Non distended Musculoskeletal: FROM. No obvious deformities Skin: Warm, dry, intact. No rashes noted Neuro: No obvious focal deficits.  Gait and station are normal  Review of Systems:  Constitutional: Negative.   HENT: Negative.    Eyes: Negative.   Respiratory: Negative.    Cardiovascular: Negative.   Gastrointestinal: Negative.   Genitourinary: Negative.   Skin: Negative.   Neurological: Negative.   Psychiatric/Behavioral:  Negative  Blood pressure 112/72, pulse 91, temperature 98.3 F (36.8 C), temperature source Oral, resp. rate 16, height 5\' 2"  (1.575 m), weight 59.1 kg, SpO2 100%. Body mass index is 23.85 kg/m.    Social History   Tobacco Use  Smoking Status Never  Smokeless Tobacco Never     Tobacco Cessation:  A prescription for an FDA approved medication for tobacco cessation was not prescribed because: Patient Refused   Blood Alcohol level:  Lab Results  Component Value Date   [facility] <15 07/12/2023   ETH <10 07/08/2020    Metabolic Disorder Labs:  Lab Results  Component Value Date   HGBA1C 5.0 07/16/2023   MPG 96.8 07/16/2023   MPG 105.41 07/09/2020   Lab Results  Component Value Date   PROLACTIN 44.9 (H) 07/09/2020   PROLACTIN 42.8 (H) 04/10/2020    Lab Results  Component Value Date   CHOL 239 (H) 07/16/2023   TRIG 100 07/16/2023   HDL 62 07/16/2023   VLDL 20 07/16/2023   LDLCALC 157 (H) 07/16/2023   LDLCALC 95 07/09/2020      See Psychiatric Specialty Exam and Suicide Risk Assessment completed by Attending Physician prior to discharge.  Discharge destination: Home  Is patient on multiple antipsychotic  therapies at discharge:  No  Has Patient had three or more failed trials of antipsychotic monotherapy by history: NA Recommended Plan for Multiple Antipsychotic Therapies: NA   Discharge Instructions     Diet - low sodium heart healthy   Complete by: As directed    Increase activity slowly   Complete by: As directed         Allergies as of 07/18/2023   No Known Allergies      Medication List     TAKE these medications      Indication  ARIPiprazole  15 MG tablet Commonly known as: ABILIFY  Take 1 tablet (15 mg total) by mouth daily. Start taking on: July 19, 2023  Indication: Manic Phase of Manic-Depression   Cholecalciferol 125 MCG (5000 UT) Tabs Take 1 tablet (5,000 Units total) by mouth daily.  Indication: Vitamin D  Deficiency   folic acid  1 MG tablet Commonly known as: FOLVITE  Take 1 tablet (1 mg  total) by mouth daily. Start taking on: July 19, 2023  Indication: Folate deficiency   lithium  carbonate 300 MG capsule Take 3 capsules (900 mg total) by mouth at bedtime.  Indication: Manic-Depression   traZODone  50 MG tablet Commonly known as: DESYREL  Take 1 tablet (50 mg total) by mouth at bedtime as needed for sleep.  Indication: Trouble Sleeping   Vitamin D  (Ergocalciferol ) 1.25 MG (50000 UNIT) Caps capsule Commonly known as: DRISDOL  Take 1 capsule (50,000 Units total) by mouth every 7 (seven) days.  Indication: Vitamin D  Deficiency          Follow-up Information     [facility] Go on 08/10/2023.   Why: You have an appointment for medication management services on 08/10/23 at 11:00 am, in person. Contact information: [address] [place] [address] [phone]         [facility] Go on 07/21/2023.   Specialty: Professional Counselor Why: Please go to this provider for an assessment on 07/21/23 at 9:00 am, to obtain therapy services.  You may also go Monday through Friday, from 9 am to 1 pm. Contact  information: [address] [place]  [address] [place] [address] [phone]                    Follow-up recommendations:  - It is recommended to the patient to continue psychiatric medications as prescribed, after discharge from the hospital.   - It is recommended to the patient to follow up with your outpatient psychiatric provider and PCP. - It was discussed with the patient, the impact of alcohol, drugs, tobacco have been there overall psychiatric and medical wellbeing, and total abstinence from substance use was recommended the patient. - Prescriptions provided or sent directly to preferred pharmacy at discharge. Patient agreeable to plan. Given opportunity to ask questions. Appears to feel comfortable with discharge.   - In the event of worsening symptoms, the patient is instructed to call the crisis hotline, 911 and or go to the nearest ED for appropriate evaluation and treatment of symptoms. To follow-up with primary care provider for other medical issues, concerns and or health care needs - Patient was discharged home with a plan to follow up as noted above.   Comments:  NA  Signed: Clair Crews, MD 07/18/23 11:29 AM

## 2023-07-18 NOTE — Plan of Care (Signed)
  Problem: Education: Goal: Knowledge of [facility] Education information/materials will improve Outcome: Completed/Met Goal: Emotional status will improve 07/18/2023 1229 by Media Spikes, RN Outcome: Completed/Met 07/18/2023 0955 by Media Spikes, RN Outcome: Adequate for Discharge Goal: Mental status will improve 07/18/2023 1229 by Media Spikes, RN Outcome: Completed/Met 07/18/2023 0955 by Media Spikes, RN Outcome: Progressing Goal: Verbalization of understanding the information provided will improve 07/18/2023 1229 by Media Spikes, RN Outcome: Completed/Met 07/18/2023 0955 by Media Spikes, RN Outcome: Progressing   Problem: Activity: Goal: Interest or engagement in activities will improve Outcome: Completed/Met Goal: Sleeping patterns will improve Outcome: Completed/Met   Problem: Coping: Goal: Ability to verbalize frustrations and anger appropriately will improve Outcome: Completed/Met Goal: Ability to demonstrate self-control will improve Outcome: Completed/Met   Problem: Health Behavior/Discharge Planning: Goal: Identification of resources available to assist in meeting health care needs will improve Outcome: Completed/Met Goal: Compliance with treatment plan for underlying cause of condition will improve Outcome: Completed/Met

## 2023-07-18 NOTE — Progress Notes (Signed)
  [facility] Adult Case Management Discharge Plan :  Will you be returning to the same living situation after discharge:  Yes,  the patient will be going home  At discharge, do you have transportation home?: Yes,  the patient stated that the patient's mother will be picking her up Do you have the ability to pay for your medications: Yes,  the patient stated that she will have no problem in having access to getting medication  Release of information consent forms completed and in the chart;  Patient's signature needed at discharge.  Patient to Follow up at:  Follow-up Information     [facility] Go on 08/10/2023.   Why: You have an appointment for medication management services on 08/10/23 at 11:00 am, in person. Contact information: [address] [place] [address] [phone]         [facility] Go on 07/21/2023.   Specialty: Professional Counselor Why: Please go to this provider for an assessment on 07/21/23 at 9:00 am, to obtain therapy services.  You may also go Monday through Friday, from 9 am to 1 pm. Contact information: [address] [place]  [address] [place] [address] [phone]                 Next level of care provider has access to [facility] Link:no  Safety Planning and Suicide Prevention discussed: Yes,  the CSW spoke to the mother 07/17/2023     Has patient been referred to the Quitline?: Patient does not use tobacco/nicotine products  Patient has been referred for addiction treatment: Patient refused referral for treatment.  Philmore Lepore O Reice [place], LCSWA 07/18/2023, 9:35 AM

## 2023-07-18 NOTE — Plan of Care (Signed)
  Problem: Education: Goal: Knowledge of [place] [facility] Education information/materials will improve Outcome: Completed/Met Goal: Emotional status will improve Outcome: Adequate for Discharge Goal: Mental status will improve Outcome: Progressing Goal: Verbalization of understanding the information provided will improve Outcome: Progressing

## 2023-07-18 NOTE — Progress Notes (Signed)
   07/18/23 0910  Psych Admission Type (Psych Patients Only)  Admission Status Involuntary  Psychosocial Assessment  Patient Complaints Anxiety  Eye Contact Fair  Facial Expression Animated  Affect Appropriate to circumstance  Speech Logical/coherent  Interaction Assertive  Motor Activity Other (Comment) (WNL)  Appearance/Hygiene Unremarkable  Behavior Characteristics Cooperative  Mood Anxious  Thought Process  Coherency WDL  Content WDL  Delusions None reported or observed  Perception WDL  Hallucination None reported or observed  Judgment Poor  Confusion None  Danger to Self  Current suicidal ideation? Denies  Danger to Others  Danger to Others None reported or observed

## 2023-07-18 NOTE — Progress Notes (Signed)
   07/18/23 0530  15 Minute Checks  Location Bedroom  Visual Appearance Calm  Behavior Sleeping  Sleep (Behavioral Health Patients Only)  Calculate sleep? (Click Yes once per 24 hr at 0600 safety check) Yes  Documented sleep last 24 hours 8.75

## 2023-07-18 NOTE — Progress Notes (Signed)
   07/18/23 0021  Psych Admission Type (Psych Patients Only)  Admission Status Involuntary  Psychosocial Assessment  Patient Complaints Anxiety  Eye Contact Fair  Facial Expression Animated  Affect Appropriate to circumstance  Speech Logical/coherent  Interaction Assertive  Motor Activity Other (Comment) (WDL)  Appearance/Hygiene Unremarkable  Behavior Characteristics Cooperative  Mood Anxious  Thought Process  Coherency WDL  Content WDL  Delusions None reported or observed  Perception WDL  Hallucination None reported or observed  Judgment Poor  Confusion None  Danger to Self  Current suicidal ideation? Denies  Danger to Others  Danger to Others None reported or observed

## 2023-07-18 NOTE — Progress Notes (Signed)
 Patient ID: Kiara Moreno, female   DOB: Jan 17, 2001, 22 y.o.   MRN: [id number] Discharge instructions, medications and follow up appointments reviewed, patient verbalized understanding. All belongings returned, pt discharged home.

## 2023-07-21 LAB — T.PALLIDUM AB, TOTAL: T Pallidum Abs: NONREACTIVE

## 2023-08-16 ENCOUNTER — Other Ambulatory Visit: Payer: Self-pay

## 2023-08-16 ENCOUNTER — Emergency Department (HOSPITAL_COMMUNITY)
Admission: EM | Admit: 2023-08-16 | Discharge: 2023-08-16 | Disposition: A | Discharge status: Home / Self Care | Payer: MEDICAID | Attending: Emergency Medicine | Admitting: Emergency Medicine

## 2023-08-16 DIAGNOSIS — N898 Other specified noninflammatory disorders of vagina: Secondary | ICD-10-CM | POA: Diagnosis present

## 2023-08-16 LAB — URINALYSIS, W/ REFLEX TO CULTURE (INFECTION SUSPECTED)
Bilirubin Urine: NEGATIVE
Glucose, UA: NEGATIVE mg/dL
Hgb urine dipstick: NEGATIVE
Ketones, ur: NEGATIVE mg/dL
Nitrite: NEGATIVE
Protein, ur: NEGATIVE mg/dL
Specific Gravity, Urine: 1.027 (ref 1.005–1.030)
pH: 6 (ref 5.0–8.0)

## 2023-08-16 LAB — RAPID HIV SCREEN (HIV 1/2 AB+AG)
HIV 1/2 Antibodies: NONREACTIVE
HIV-1 P24 Antigen - HIV24: NONREACTIVE

## 2023-08-16 LAB — HCG, QUANTITATIVE, PREGNANCY: hCG, Beta Chain, Quant, S: <1 m[IU]/mL (ref <5)

## 2023-08-16 LAB — WET PREP, GENITAL
Clue Cells Wet Prep HPF POC: NONE SEEN
Sperm: NONE SEEN
Trich, Wet Prep: NONE SEEN
WBC, Wet Prep HPF POC: >=10 — AB (ref <10)
Yeast Wet Prep HPF POC: NONE SEEN

## 2023-08-16 MED ORDER — DOXYCYCLINE HYCLATE 100 MG PO CAPS
100.0000 mg | ORAL_CAPSULE | Freq: Two times a day (BID) | ORAL | 0 refills | Status: AC
  Filled 2023-08-16: qty 14, 7d supply, fill #0

## 2023-08-16 MED ORDER — DOXYCYCLINE HYCLATE 100 MG PO TABS
100.0000 mg | ORAL_TABLET | Freq: Once | ORAL | Status: AC
  Administered 2023-08-16: 100 mg via ORAL
  Filled 2023-08-16: qty 1

## 2023-08-16 MED ORDER — LIDOCAINE HCL 1 % IJ SOLN
INTRAMUSCULAR | Status: AC
  Administered 2023-08-16: 2.1 mL
  Filled 2023-08-16: qty 20

## 2023-08-16 MED ORDER — CEFTRIAXONE SODIUM 1 G IJ SOLR
500.0000 mg | Freq: Once | INTRAMUSCULAR | Status: AC
  Administered 2023-08-16: 500 mg via INTRAMUSCULAR
  Filled 2023-08-16: qty 10

## 2023-08-16 NOTE — ED Triage Notes (Signed)
 Patient to ED by POV with c/o dark yellow vaginal discharge x2-3 days.

## 2023-08-16 NOTE — Discharge Instructions (Signed)
 While you are in the emergency room, you had swabs done.  Your swabs did not show trichomonas.  With your symptoms, we are going to treat you for gonorrhea and chlamydia.  We will have the results of these test within 24 to 48 hours.  You can check the results in your MyChart app.  You will be contacted if they are positive.  I have sent your prescription for an antibiotic called doxycycline .  You can take this once in the morning and once in the evening for the next 1 week.

## 2023-08-16 NOTE — ED Provider Notes (Signed)
 [facility] AT [facility] Provider Note   CSN: [id number] Arrival date & time: 08/16/23  9082     Patient presents with: Vaginal Discharge   Kiara Moreno is a 23 y.o. female.   23 year old female here today with complaint of dark yellow vaginal discharge by 2 to 3 days.  She reports that she is monogamous with 1 partner, they do not use contraceptive or barrier contraceptives.  She has a prior history of trichomonas, believes that this is likely trichomonas.  Denies any abdominal pain.   Vaginal Discharge      Prior to Admission medications   Medication Sig Start Date End Date Taking? Authorizing Provider  ARIPiprazole  (ABILIFY ) 15 MG tablet Take 1 tablet (15 mg total) by mouth daily. 07/19/23   Kennyth Starleen RAMAN, MD  Cholecalciferol  125 MCG (5000 UT) TABS Take 1 tablet (5,000 Units total) by mouth daily. 07/18/23   Kennyth Starleen RAMAN, MD  folic acid  (FOLVITE ) 1 MG tablet Take 1 tablet (1 mg total) by mouth daily. 07/19/23   Kennyth Starleen RAMAN, MD  lithium  carbonate 300 MG capsule Take 3 capsules (900 mg total) by mouth at bedtime. 07/18/23   Kennyth Starleen RAMAN, MD  traZODone  (DESYREL ) 50 MG tablet Take 1 tablet (50 mg total) by mouth at bedtime as needed for sleep. 07/18/23   Kennyth Starleen RAMAN, MD  Vitamin D , Ergocalciferol , (DRISDOL ) 1.25 MG (50000 UNIT) CAPS capsule Take 1 capsule (50,000 Units total) by mouth every 7 (seven) days. 07/18/23   Kennyth Starleen RAMAN, MD    Allergies: Patient has no known allergies.    Review of Systems  Genitourinary:  Positive for vaginal discharge.    Updated Vital Signs BP (!) 111/94 (BP Location: Right Arm)   Pulse 80   Temp 98.7 F (37.1 C) (Oral)   Resp 18   Ht 5' 2 (1.575 m)   Wt 60 kg   SpO2 100%   BMI 24.19 kg/m   Physical Exam Vitals and nursing note reviewed.  HENT:     Head: Normocephalic.   Cardiovascular:     Rate and Rhythm: Normal rate.  Pulmonary:     Effort: Pulmonary effort is normal.  Abdominal:      General: Abdomen is flat. There is no distension.     Palpations: Abdomen is soft.     Tenderness: There is no abdominal tenderness.  Genitourinary:    Comments: Set up for pelvic exam, however patient declined.  Musculoskeletal:        General: Normal range of motion.   Skin:    General: Skin is warm.     (all labs ordered are listed, but only abnormal results are displayed) Labs Reviewed  WET PREP, GENITAL - Abnormal; Notable for the following components:      Result Value   WBC, Wet Prep HPF POC >=10 (*)    All other components within normal limits  URINALYSIS, W/ REFLEX TO CULTURE (INFECTION SUSPECTED) - Abnormal; Notable for the following components:   APPearance HAZY (*)    Leukocytes,Ua LARGE (*)    Bacteria, UA RARE (*)    All other components within normal limits  RAPID HIV SCREEN (HIV 1/2 AB+AG)  HCG, QUANTITATIVE, PREGNANCY  RPR  GC/CHLAMYDIA PROBE AMP ([place]) NOT AT [facility]    EKG: None  Radiology: No results found.   Procedures   Medications Ordered in the ED  doxycycline  (VIBRA -TABS) tablet 100 mg (has no administration in time range)  cefTRIAXone  (  ROCEPHIN ) injection 500 mg (has no administration in time range)                                    Medical Decision Making 23 year old female here today due to yellow vaginal discharge.  Differential diagnosis include trichomonas, GC.  Plan-I have set the patient up for pelvic exam, however is again ready to do the exam, she requested to do a self swab instead.  Explained that this was a little bit less sensitive, however patient strongly preferred to self swab.  She has no abdominal pain or tenderness.  Ordered HIV and RPR on the patient.  Reassessment 11:40 PM-negative wet prep aside from white cells.  Will empirically treat for gonorrhea chlamydia.  Will discharge.  This patient's health care is complicated by the following social determinants of health-lack of access to primary care.  Amount  and/or Complexity of Data Reviewed Labs: ordered.  Risk Prescription drug management.        Final diagnoses:  Vaginal discharge    ED Discharge Orders     None          Mannie Fairy DASEN, DO 08/16/23 1142

## 2023-08-17 ENCOUNTER — Other Ambulatory Visit (HOSPITAL_BASED_OUTPATIENT_CLINIC_OR_DEPARTMENT_OTHER): Payer: Self-pay

## 2023-08-17 LAB — RPR
RPR Ser Ql: REACTIVE — AB
RPR Titer: 1:1 {titer}

## 2023-08-18 LAB — T.PALLIDUM AB, TOTAL: T Pallidum Abs: NONREACTIVE

## 2023-08-18 LAB — GC/CHLAMYDIA PROBE AMP (~~LOC~~) NOT AT ARMC
Chlamydia: NEGATIVE
Comment: NEGATIVE
Comment: NORMAL
Neisseria Gonorrhea: POSITIVE — AB

## 2023-08-19 ENCOUNTER — Ambulatory Visit (HOSPITAL_COMMUNITY): Payer: Self-pay

## 2023-08-20 ENCOUNTER — Other Ambulatory Visit (HOSPITAL_BASED_OUTPATIENT_CLINIC_OR_DEPARTMENT_OTHER): Payer: Self-pay
